# Patient Record
Sex: Female | Born: 1980
Health system: Southern US, Community
[De-identification: ages and names within clinical notes are randomized; demographics above are authoritative.]

## PROBLEM LIST (undated history)

## (undated) DIAGNOSIS — O905 Postpartum thyroiditis: Secondary | ICD-10-CM

## (undated) DIAGNOSIS — A609 Anogenital herpesviral infection, unspecified: Secondary | ICD-10-CM

## (undated) DIAGNOSIS — R569 Unspecified convulsions: Secondary | ICD-10-CM

## (undated) DIAGNOSIS — D1800 Hemangioma unspecified site: Secondary | ICD-10-CM

## (undated) HISTORY — DX: Postpartum thyroiditis: O90.5

## (undated) HISTORY — DX: Unspecified convulsions: R56.9

## (undated) HISTORY — DX: Hemangioma unspecified site: D18.00

## (undated) HISTORY — PX: KNEE DISLOCATION SURGERY: SHX689

## (undated) HISTORY — PX: ADENOIDECTOMY: SUR15

---

## 2010-12-05 HISTORY — PX: TONSILLECTOMY: SUR1361

## 2011-11-02 LAB — OB RESULTS CONSOLE HEPATITIS B SURFACE ANTIGEN: Hepatitis B Surface Ag: NEGATIVE

## 2011-11-02 LAB — OB RESULTS CONSOLE ABO/RH: RH Type: POSITIVE

## 2011-11-07 LAB — OB RESULTS CONSOLE GC/CHLAMYDIA
Chlamydia: NEGATIVE
Gonorrhea: NEGATIVE

## 2011-12-06 ENCOUNTER — Inpatient Hospital Stay (HOSPITAL_COMMUNITY): Admission: AD | Admit: 2011-12-06 | Payer: Self-pay | Source: Ambulatory Visit | Admitting: Obstetrics & Gynecology

## 2011-12-06 NOTE — L&D Delivery Note (Signed)
Delivery Note At 5:33 AM a viable and healthy female was delivered via Vaginal, Spontaneous Delivery (Presentation: ; Occiput Anterior).right hand compound  APGAR: 7, 9; weight 6 lb 15 oz (3147 g).   Placenta status: Intact, Spontaneous, marginal cord Pathology.  Cord: 3 vessels with the following complications: None.  Cord pH: none  Anesthesia: Epidural  Episiotomy: None Lacerations: Vaginal;Sulcus Suture Repair: 3.0 chromic Est. Blood Loss (mL): 300  Mom to postpartum.  Baby to nursery-stable.  Hoover Grewe A 05/30/2012, 6:10 AM

## 2012-03-09 LAB — OB RESULTS CONSOLE RPR
RPR: NONREACTIVE
RPR: NONREACTIVE

## 2012-05-27 ENCOUNTER — Encounter (HOSPITAL_COMMUNITY): Payer: Self-pay | Admitting: *Deleted

## 2012-05-27 ENCOUNTER — Inpatient Hospital Stay (HOSPITAL_COMMUNITY)
Admission: AD | Admit: 2012-05-27 | Discharge: 2012-05-27 | Disposition: A | Discharge status: Home / Self Care | Payer: BC Managed Care – PPO | Source: Ambulatory Visit | Attending: Obstetrics & Gynecology | Admitting: Obstetrics & Gynecology

## 2012-05-27 DIAGNOSIS — O99891 Other specified diseases and conditions complicating pregnancy: Secondary | ICD-10-CM | POA: Insufficient documentation

## 2012-05-27 HISTORY — DX: Anogenital herpesviral infection, unspecified: A60.9

## 2012-05-27 NOTE — ED Provider Notes (Signed)
Carmen Perez is a 31 y.o. female G1P0 [redacted]w[redacted]d presenting for possibility of rupture of membranes.  HPI:  Felt fluid leak x 1 around lunch time today.  No further leakage.  Mild irregular UCs.  Good FMs.  OB History    Grav Para Term Preterm Abortions TAB SAB Ect Mult Living   1              Past Medical History  Diagnosis Date  . HSV (herpes simplex virus) anogenital infection    Past Surgical History  Procedure Date  . Tonsillectomy   . Knee dislocation surgery    Family History: family history is negative for Other. Social History:  reports that she has never smoked. She does not have any smokeless tobacco history on file. She reports that she does not drink alcohol or use illicit drugs. No current facility-administered medications for this encounter. No Known Allergies  Dilation: 3 Effacement (%): 90 Exam by:: Dr Fuller Plan Blood pressure 120/74, pulse 87, temperature 98.4 F (36.9 C), temperature source Oral, resp. rate 16, height 5\' 7"  (1.702 m), weight 77.928 kg (171 lb 12.8 oz).  No evidence of AF.  Fern neg. FHR 140's accelerations present.  Mild non repetitive variable decelerations x 2. Irregular mild UCs.  HPP: There is no problem list on file for this patient.   Prenatal labs: ABO, Rh:  pos Antibody:  neg Rubella:  Imm RPR:  NR HBsAg:  NR HIV:    Genetic testing:Quad neg [place] anato: wnl 1 hr GTT: 99 wnl GBS:  neg  Assessment/Plan:  39+ wks  No evidence of SROM, not in labor.  Will complete NST.  If reactive, d/c home.  Labor precautions discussed.  Jaidyn Kuhl,MARIE-LYNE 05/27/2012, 3:25 PM

## 2012-05-27 NOTE — MAU Note (Signed)
?  SROM @ 1215 today;

## 2012-05-27 NOTE — MAU Note (Signed)
Pt reports having a little bit fluid trickle out. No c/o contractions or bleeding. Good fetal movement

## 2012-05-27 NOTE — MAU Note (Signed)
Delivered vaginally on 02-24-2011; at her 6 week postpartum check-up -she was started on Depo; missed October Depo injection; has not had a period; pt has not had a regular period since her baby was born; 

## 2012-05-27 NOTE — MAU Note (Signed)
Delivered vaginally on 02-24-2011; at her 6 week postpartum check-up -she was started on Depo; missed October Depo injection; has not had a period; pt has not had a regular period since her baby was born;

## 2012-05-27 NOTE — Discharge Instructions (Signed)
Normal Labor and Delivery Your caregiver must first be sure you are in labor. Signs of labor include:  You may pass what is called "the mucus plug" before labor begins. This is a small amount of blood stained mucus.   Regular uterine contractions.   The time between contractions get closer together.   The discomfort and pain gradually gets more intense.   Pains are mostly located in the back.   Pains get worse when walking.   The cervix (the opening of the uterus becomes thinner (begins to efface) and opens up (dilates).  Once you are in labor and admitted into the hospital or care center, your caregiver will do the following:  A complete physical examination.   Check your vital signs (blood pressure, pulse, temperature and the fetal heart rate).   Do a vaginal examination (using a sterile glove and lubricant) to determine:   The position (presentation) of the baby (head [vertex] or buttock first).   The level (station) of the baby's head in the birth canal.   The effacement and dilatation of the cervix.   You may have your pubic hair shaved and be given an enema depending on your caregiver and the circumstance.   An electronic monitor is usually placed on your abdomen. The monitor follows the length and intensity of the contractions, as well as the baby's heart rate.   Usually, your caregiver will insert an IV in your arm with a bottle of sugar water. This is done as a precaution so that medications can be given to you quickly during labor or delivery.  NORMAL LABOR AND DELIVERY IS DIVIDED UP INTO 3 STAGES: First Stage This is when regular contractions begin and the cervix begins to efface and dilate. This stage can last from 3 to 15 hours. The end of the first stage is when the cervix is 100% effaced and 10 centimeters dilated. Pain medications may be given by   Injection (morphine, demerol, etc.)   Regional anesthesia (spinal, caudal or epidural, anesthetics given in  different locations of the spine). Paracervical pain medication may be given, which is an injection of and anesthetic on each side of the cervix.  A pregnant woman may request to have "Natural Childbirth" which is not to have any medications or anesthesia during her labor and delivery. Second Stage This is when the baby comes down through the birth canal (vagina) and is born. This can take 1 to 4 hours. As the baby's head comes down through the birth canal, you may feel like you are going to have a bowel movement. You will get the urge to bear down and push until the baby is delivered. As the baby's head is being delivered, the caregiver will decide if an episiotomy (a cut in the perineum and vagina area) is needed to prevent tearing of the tissue in this area. The episiotomy is sewn up after the delivery of the baby and placenta. Sometimes a mask with nitrous oxide is given for the mother to breath during the delivery of the baby to help if there is too much pain. The end of Stage 2 is when the baby is fully delivered. Then when the umbilical cord stops pulsating it is clamped and cut. Third Stage The third stage begins after the baby is completely delivered and ends after the placenta (afterbirth) is delivered. This usually takes 5 to 30 minutes. After the placenta is delivered, a medication is given either by intravenous or injection to help contract   the uterus and prevent bleeding. The third stage is not painful and pain medication is usually not necessary. If an episiotomy was done, it is repaired at this time. After the delivery, the mother is watched and monitored closely for 1 to 2 hours to make sure there is no postpartum bleeding (hemorrhage). If there is a lot of bleeding, medication is given to contract the uterus and stop the bleeding. Document Released: 08/30/2008 Document Revised: 11/10/2011 Document Reviewed: 08/30/2008 [employer] Patient Information 2012 [employer]. 

## 2012-05-29 ENCOUNTER — Inpatient Hospital Stay (HOSPITAL_COMMUNITY)
Admission: AD | Admit: 2012-05-29 | Discharge: 2012-06-01 | DRG: 373 | Disposition: A | Discharge status: Home / Self Care | Payer: BC Managed Care – PPO | Source: Ambulatory Visit | Attending: Obstetrics and Gynecology | Admitting: Obstetrics and Gynecology

## 2012-05-29 ENCOUNTER — Encounter (HOSPITAL_COMMUNITY): Payer: Self-pay | Admitting: *Deleted

## 2012-05-29 ENCOUNTER — Inpatient Hospital Stay (HOSPITAL_COMMUNITY): Payer: BC Managed Care – PPO | Admitting: Anesthesiology

## 2012-05-29 ENCOUNTER — Encounter (HOSPITAL_COMMUNITY): Payer: Self-pay | Admitting: Anesthesiology

## 2012-05-29 DIAGNOSIS — O328XX0 Maternal care for other malpresentation of fetus, not applicable or unspecified: Secondary | ICD-10-CM | POA: Diagnosis present

## 2012-05-29 LAB — CBC
HCT: 38.2 % (ref 36.0–46.0)
Hemoglobin: 13.2 g/dL (ref 12.0–15.0)
MCV: 88.6 fL (ref 78.0–100.0)
RBC: 4.31 MIL/uL (ref 3.87–5.11)
WBC: 13.1 10*3/uL — ABNORMAL HIGH (ref 4.0–10.5)

## 2012-05-29 MED ORDER — LACTATED RINGERS IV SOLN
INTRAVENOUS | Status: DC
  Administered 2012-05-29: 23:00:00 via INTRAVENOUS
  Administered 2012-05-29: 1000 mL via INTRAVENOUS
  Administered 2012-05-30: 02:00:00 via INTRAVENOUS

## 2012-05-29 MED ORDER — ONDANSETRON HCL 4 MG/2ML IJ SOLN: 4.0000 mg | Freq: Four times a day (QID) | INTRAMUSCULAR | Status: DC | PRN

## 2012-05-29 MED ORDER — OXYTOCIN 40 UNITS IN LACTATED RINGERS INFUSION - SIMPLE MED
INTRAVENOUS | Status: AC
  Filled 2012-05-29: qty 1000

## 2012-05-29 MED ORDER — DIPHENHYDRAMINE HCL 50 MG/ML IJ SOLN: 12.5000 mg | INTRAMUSCULAR | Status: DC | PRN

## 2012-05-29 MED ORDER — LACTATED RINGERS IV SOLN
500.0000 mL | Freq: Once | INTRAVENOUS | Status: AC
  Administered 2012-05-29: 500 mL via INTRAVENOUS

## 2012-05-29 MED ORDER — SODIUM BICARBONATE 8.4 % IV SOLN
INTRAVENOUS | Status: DC | PRN
  Administered 2012-05-29: 4 mL via EPIDURAL

## 2012-05-29 MED ORDER — CITRIC ACID-SODIUM CITRATE 334-500 MG/5ML PO SOLN: 30.0000 mL | ORAL | Status: DC | PRN

## 2012-05-29 MED ORDER — LIDOCAINE HCL (PF) 1 % IJ SOLN
INTRAMUSCULAR | Status: AC
  Filled 2012-05-29: qty 30

## 2012-05-29 MED ORDER — FLEET ENEMA 7-19 GM/118ML RE ENEM: 1.0000 | ENEMA | RECTAL | Status: DC | PRN

## 2012-05-29 MED ORDER — PHENYLEPHRINE 40 MCG/ML (10ML) SYRINGE FOR IV PUSH (FOR BLOOD PRESSURE SUPPORT): 80.0000 ug | PREFILLED_SYRINGE | INTRAVENOUS | Status: DC | PRN

## 2012-05-29 MED ORDER — NALBUPHINE HCL 10 MG/ML IJ SOLN: 10.0000 mg | INTRAMUSCULAR | Status: DC | PRN

## 2012-05-29 MED ORDER — EPHEDRINE 5 MG/ML INJ
10.0000 mg | INTRAVENOUS | Status: DC | PRN
  Administered 2012-05-29: 10 mg via INTRAVENOUS
  Filled 2012-05-29: qty 4

## 2012-05-29 MED ORDER — OXYTOCIN BOLUS FROM INFUSION
250.0000 mL | Freq: Once | INTRAVENOUS | Status: DC
  Filled 2012-05-29: qty 500

## 2012-05-29 MED ORDER — OXYTOCIN 40 UNITS IN LACTATED RINGERS INFUSION - SIMPLE MED
62.5000 mL/h | Freq: Once | INTRAVENOUS | Status: AC
  Administered 2012-05-30: 125 mL/h via INTRAVENOUS

## 2012-05-29 MED ORDER — FENTANYL 2.5 MCG/ML BUPIVACAINE 1/10 % EPIDURAL INFUSION (WH - ANES)
14.0000 mL/h | INTRAMUSCULAR | Status: DC
  Administered 2012-05-30 (×2): 14 mL/h via EPIDURAL
  Filled 2012-05-29 (×4): qty 60

## 2012-05-29 MED ORDER — LIDOCAINE HCL (PF) 1 % IJ SOLN: 30.0000 mL | INTRAMUSCULAR | Status: DC | PRN

## 2012-05-29 MED ORDER — LACTATED RINGERS IV SOLN: 500.0000 mL | INTRAVENOUS | Status: DC | PRN

## 2012-05-29 MED ORDER — IBUPROFEN 600 MG PO TABS: 600.0000 mg | ORAL_TABLET | Freq: Four times a day (QID) | ORAL | Status: DC | PRN

## 2012-05-29 MED ORDER — ACETAMINOPHEN 325 MG PO TABS: 650.0000 mg | ORAL_TABLET | ORAL | Status: DC | PRN

## 2012-05-29 MED ORDER — EPHEDRINE 5 MG/ML INJ
10.0000 mg | INTRAVENOUS | Status: AC | PRN
  Administered 2012-05-29 (×2): 10 mg via INTRAVENOUS
  Filled 2012-05-29 (×2): qty 4

## 2012-05-29 MED ORDER — FENTANYL 2.5 MCG/ML BUPIVACAINE 1/10 % EPIDURAL INFUSION (WH - ANES)
INTRAMUSCULAR | Status: DC | PRN
  Administered 2012-05-29: 14 mL/h via EPIDURAL

## 2012-05-29 MED ORDER — PHENYLEPHRINE 40 MCG/ML (10ML) SYRINGE FOR IV PUSH (FOR BLOOD PRESSURE SUPPORT)
80.0000 ug | PREFILLED_SYRINGE | INTRAVENOUS | Status: DC | PRN
  Administered 2012-05-29 (×2): 80 ug via INTRAVENOUS
  Filled 2012-05-29 (×2): qty 5

## 2012-05-29 MED ORDER — OXYCODONE-ACETAMINOPHEN 5-325 MG PO TABS: 1.0000 | ORAL_TABLET | ORAL | Status: DC | PRN

## 2012-05-29 NOTE — H&P (Signed)
DAMA Perez is Perez 31 y.o. female presenting for admission for active labor. Intact membrane. GBS cx neg. Hx HSV on Valtrex. Denies recent outbreak or prodromal sx  Maternal Medical History:  Reason for admission: Reason for admission: contractions.  Contractions: Frequency: regular.    Fetal activity: Perceived fetal activity is normal.    Prenatal complications: no prenatal complications   OB History    Grav Para Term Preterm Abortions TAB SAB Ect Mult Living   1              Past Medical History  Diagnosis Date  . HSV (herpes simplex virus) anogenital infection    Past Surgical History  Procedure Date  . Tonsillectomy   . Knee dislocation surgery    Family History: family history is negative for Other. Social History:  reports that she has never smoked. She does not have any smokeless tobacco history on file. She reports that she does not drink alcohol or use illicit drugs.   Prenatal Transfer Tool  Maternal Diabetes: No Genetic Screening: Normal Maternal Ultrasounds/Referrals: Normal Fetal Ultrasounds or other Referrals:  None Maternal Substance Abuse:  No Significant Maternal Medications:  None Significant Maternal Lab Results:  Lab values include: Group B Strep negative Other Comments:  None  ROSneg  Dilation: 8.5 Effacement (%): 100 Station: 0;-1 Exam by:: Diamond Nickel, RN Blood pressure 115/69, pulse 65, temperature 97.4 F (36.3 C), temperature source Oral, resp. rate 22, height 5\' 7"  (1.702 m), weight 77.565 kg (171 lb). Maternal Exam:  Uterine Assessment: Contraction strength is moderate.  Abdomen: Patient reports no abdominal tenderness. Introitus: Normal vulva. Normal vagina.  Cervix: Cervix evaluated by digital exam.     Physical Exam  Constitutional: She is oriented to person, place, and time. She appears well-developed and well-nourished.  HENT:  Head: Atraumatic.  Eyes: EOM are normal.  Neck: Neck supple.  Cardiovascular: Normal rate and  regular rhythm.   Respiratory: Breath sounds normal.  GI: Soft.  Musculoskeletal: Normal range of motion. She exhibits no edema.  Neurological: She is alert and oriented to person, place, and time.  Skin: Skin is warm and dry.  Psychiatric: She has Perez normal mood and affect.   VE: 6/90/0 station (+) bloody show  Prenatal labs: ABO, Rh:  O positive Antibody:  neg Rubella:  immune RPR:   nr HBsAg:   neg HIV:   neg GBS:   neg  Assessment/Plan: Active labor  Term gestation P) admit. Routine labs. Analgesic. Pt declines amniotomy    Carmen Perez 05/29/2012, 9:21 PM

## 2012-05-29 NOTE — Progress Notes (Signed)
S: notes intermittent rectal pressure  O: epidural  VE fully /0 station bulging membrane. AROM : mod meconium. ISE placed LOA  Tracing: baseline 140's minimum variability ? Ctx frequency  IMP: Complete P) labor vtx down. Cont mat O2. Pitocin prn

## 2012-05-29 NOTE — Anesthesia Procedure Notes (Signed)

## 2012-05-29 NOTE — MAU Note (Signed)
Pt reports ctx started a 7 pm very strong. Pt reports some leaking of pinkish fluid. Reports good fetal movment

## 2012-05-29 NOTE — MAU Note (Signed)
Dr Cousins at bedside discussing plan of care. 

## 2012-05-29 NOTE — Anesthesia Preprocedure Evaluation (Signed)

## 2012-05-30 ENCOUNTER — Encounter (HOSPITAL_COMMUNITY): Payer: Self-pay | Admitting: *Deleted

## 2012-05-30 MED ORDER — OXYTOCIN 40 UNITS IN LACTATED RINGERS INFUSION - SIMPLE MED
1.0000 m[IU]/min | INTRAVENOUS | Status: DC
  Administered 2012-05-30: 2 m[IU]/min via INTRAVENOUS

## 2012-05-30 MED ORDER — LACTATED RINGERS IV SOLN
INTRAVENOUS | Status: DC
  Administered 2012-05-30: 1000 mL via INTRAUTERINE

## 2012-05-30 MED ORDER — TERBUTALINE SULFATE 1 MG/ML IJ SOLN: 0.2500 mg | Freq: Once | INTRAMUSCULAR | Status: DC | PRN

## 2012-05-30 MED ORDER — SENNOSIDES-DOCUSATE SODIUM 8.6-50 MG PO TABS
2.0000 | ORAL_TABLET | Freq: Every day | ORAL | Status: DC
  Administered 2012-05-30 – 2012-05-31 (×2): 2 via ORAL

## 2012-05-30 MED ORDER — SIMETHICONE 80 MG PO CHEW: 80.0000 mg | CHEWABLE_TABLET | ORAL | Status: DC | PRN

## 2012-05-30 MED ORDER — BENZOCAINE-MENTHOL 20-0.5 % EX AERO
1.0000 "application " | INHALATION_SPRAY | CUTANEOUS | Status: DC | PRN
  Filled 2012-05-30: qty 56

## 2012-05-30 MED ORDER — ONDANSETRON HCL 4 MG/2ML IJ SOLN: 4.0000 mg | INTRAMUSCULAR | Status: DC | PRN

## 2012-05-30 MED ORDER — LANOLIN HYDROUS EX OINT: TOPICAL_OINTMENT | CUTANEOUS | Status: DC | PRN

## 2012-05-30 MED ORDER — FERROUS SULFATE 325 (65 FE) MG PO TABS
325.0000 mg | ORAL_TABLET | Freq: Two times a day (BID) | ORAL | Status: DC
  Administered 2012-05-30 – 2012-05-31 (×2): 325 mg via ORAL
  Filled 2012-05-30 (×2): qty 1

## 2012-05-30 MED ORDER — TETANUS-DIPHTH-ACELL PERTUSSIS 5-2.5-18.5 LF-MCG/0.5 IM SUSP
0.5000 mL | Freq: Once | INTRAMUSCULAR | Status: AC
  Administered 2012-05-31: 0.5 mL via INTRAMUSCULAR
  Filled 2012-05-30: qty 0.5

## 2012-05-30 MED ORDER — ONDANSETRON HCL 4 MG PO TABS: 4.0000 mg | ORAL_TABLET | ORAL | Status: DC | PRN

## 2012-05-30 MED ORDER — WITCH HAZEL-GLYCERIN EX PADS: 1.0000 "application " | MEDICATED_PAD | CUTANEOUS | Status: DC | PRN

## 2012-05-30 MED ORDER — DIBUCAINE 1 % RE OINT
1.0000 "application " | TOPICAL_OINTMENT | RECTAL | Status: DC | PRN
  Administered 2012-06-01: 1 via RECTAL
  Filled 2012-05-30: qty 28

## 2012-05-30 MED ORDER — DIPHENHYDRAMINE HCL 25 MG PO CAPS: 25.0000 mg | ORAL_CAPSULE | Freq: Four times a day (QID) | ORAL | Status: DC | PRN

## 2012-05-30 MED ORDER — ZOLPIDEM TARTRATE 5 MG PO TABS: 5.0000 mg | ORAL_TABLET | Freq: Every evening | ORAL | Status: DC | PRN

## 2012-05-30 MED ORDER — OXYCODONE-ACETAMINOPHEN 5-325 MG PO TABS: 1.0000 | ORAL_TABLET | ORAL | Status: DC | PRN

## 2012-05-30 MED ORDER — PRENATAL MULTIVITAMIN CH
1.0000 | ORAL_TABLET | Freq: Every day | ORAL | Status: DC
  Administered 2012-05-31 – 2012-06-01 (×2): 1 via ORAL
  Filled 2012-05-30 (×2): qty 1

## 2012-05-30 MED ORDER — IBUPROFEN 600 MG PO TABS
600.0000 mg | ORAL_TABLET | Freq: Four times a day (QID) | ORAL | Status: DC
  Administered 2012-05-30 – 2012-06-01 (×7): 600 mg via ORAL
  Filled 2012-05-30 (×8): qty 1

## 2012-05-30 NOTE — Progress Notes (Signed)
S:  Called by RN regarding pt pushing for 1 1/2 hours  O: Pitocin 4 MIU  Tracing: reassuring  ctx q 2 mins  VE: fully +1/+2 station OP  IMP: Complete w/ OP presentation  P) cont pushing. Unable to increase pitocin. Not pushed long enough to call failed descent

## 2012-05-30 NOTE — Progress Notes (Signed)
Pushing

## 2012-05-30 NOTE — Progress Notes (Signed)
Dr. Cherly Hensen at bedside from 0000 to 0036 to assess patient trial pushing and effectiveness of contractions. Orders for amnioinfusion and pitocin received and implemented.

## 2012-05-30 NOTE — Progress Notes (Signed)
S: notes some rectal pressure  O: Epidural  VE: fully +1 station  Pushed with pt. Mild ctx. Ctx q 3-4 mins   tracing; some early decels baseline130's -140 (+) scalp stimulation  IUPC placed. Amnioinfusion ordered  IMP: Suboptimal ctx w/ poss cord compression  P) stop pushing. Amnioinfusion. Pitocin augmentation. Plan explained to pt and husband

## 2012-05-30 NOTE — Progress Notes (Signed)
0236 Pushing, spoke with Dr. Cherly Hensen, cannot increase pitocin due to contraction frequency, however contractions are not of good quality.

## 2012-05-31 LAB — CBC
MCH: 30.2 pg (ref 26.0–34.0)
MCHC: 33.2 g/dL (ref 30.0–36.0)
MCV: 90.9 fL (ref 78.0–100.0)
Platelets: 281 10*3/uL (ref 150–400)
RBC: 3.74 MIL/uL — ABNORMAL LOW (ref 3.87–5.11)
RDW: 14.3 % (ref 11.5–15.5)

## 2012-05-31 NOTE — Anesthesia Postprocedure Evaluation (Signed)
  Anesthesia Post-op Note  Patient: Carmen Perez  Procedure(s) Performed: * No procedures listed *  Patient Location: Mother/Baby  Anesthesia Type: Epidural  Level of Consciousness: awake, alert  and oriented  Airway and Oxygen Therapy: Patient Spontanous Breathing  Post-op Pain: none  Post-op Assessment: Post-op Vital signs reviewed and Patient's Cardiovascular Status Stable  Post-op Vital Signs: Reviewed and stable  Complications: No apparent anesthesia complications

## 2012-05-31 NOTE — Progress Notes (Signed)
PPD 1 SVD  S:  Reports feeling well             Tolerating po/ No nausea or vomiting             Bleeding is light             Pain controlled with motrin and percocet occasionally             Up ad lib / ambulatory  Newborn breast feeding - having some difficulty with latch    O:  A & O x 3              VS: Blood pressure 116/80, pulse 89, temperature 97.7 F (36.5 C), temperature source Oral, resp. rate 18, height 5\' 7"  (1.702 m), weight 77.565 kg (171 lb), SpO2 100.00%, unknown if currently breastfeeding.  LABS: WBC/Hgb/Hct/Plts:  13.4/11.3/34.0/281 (06/27 0545)   Lungs: Clear and unlabored  Heart: regular rate and rhythm / no mumurs  Abdomen: soft, non-tender, non-distended              Fundus: firm, non-tender, U-1  Perineum: no edema / ice pack in place  Lochia: light   Extremities: no edema, no calf pain or tenderness    A: PPD # 1   Doing well - stable status  P:  Routine post partum orders  Anticipate discharge tomorrow             Lactation guidance today  Marlinda Mike SNM 05/31/2012, 10:14 AM

## 2012-06-01 MED ORDER — IBUPROFEN 600 MG PO TABS: 600.0000 mg | ORAL_TABLET | Freq: Four times a day (QID) | ORAL | Status: AC

## 2012-06-01 MED ORDER — OXYCODONE-ACETAMINOPHEN 5-325 MG PO TABS: 1.0000 | ORAL_TABLET | Freq: Four times a day (QID) | ORAL | Status: AC | PRN

## 2012-06-01 NOTE — Discharge Summary (Signed)
Obstetric Discharge Summary Reason for Admission: onset of labor and [redacted]wk gestation Prenatal Procedures: ultrasound Intrapartum Procedures: spontaneous vaginal delivery Postpartum Procedures: none Complications-Operative and Postpartum: none Hemoglobin  Date Value Range Status  05/31/2012 11.3* 12.0 - 15.0 g/dL Final     HCT  Date Value Range Status  05/31/2012 34.0* 36.0 - 46.0 % Final    Physical Exam:  General: alert, cooperative and no distress Lochia: appropriate Uterine Fundus: firm Incision: n/a DVT Evaluation: No evidence of DVT seen on physical exam.  Discharge Diagnoses: Term Pregnancy-delivered  Discharge Information: Date: 06/01/2012 Activity: pelvic rest Diet: routine Medications: PNV, Ibuprofen and Percocet Condition: stable Instructions: refer to practice specific booklet Discharge to: home   Newborn Data: Live born female on 05/30/12 Birth Weight: 6 lb 15 oz (3147 g) APGAR: 7, 9  Home with mother.  Brandan Robicheaux K 06/01/2012, 9:05 AM

## 2012-06-01 NOTE — Progress Notes (Signed)
Patient ID: Carmen Perez, female   DOB: [birth year], 31 y.o.   MRN: [id number] PPD # 2  Subjective: Pt reports feeling well and eager for d/c home/ Pain controlled with ibuprofen and rare percocet Tolerating po/ Voiding without problems/ No n/v Bleeding is light/ Newborn info:  Information for the patient's newborn:  Amarissa, Koerner Girl Brylie [[id number]]  female Feeding: breast    Objective:  VS: Blood pressure 101/61, pulse 73, temperature 98.7 F (37.1 C), temperature source Oral, resp. rate 20   Basename 05/31/12 0545 05/29/12 2055  WBC 13.4* 13.1*  HGB 11.3* 13.2  HCT 34.0* 38.2  PLT 281 345    Blood type: --/--/O POS (06/25 2130) Rubella: Immune (11/28 0000)    Physical Exam:  General: A & O x 3  alert, cooperative and no distress CV: Regular rate and rhythm Resp: clear Abdomen: soft, nontender, normal bowel sounds Uterine Fundus: firm, below umbilicus, nontender Perineum: healing well; no edema Lochia: moderate Ext: Homans sign is negative, no sign of DVT and no edema, redness or tenderness in the calves or thighs    A/P: PPD # 2/ G1P1001/ S/P:spontaneous vaginal delivery Doing well and stable for discharge home RX: Ibuprofen 600mg  po Q 6 hrs prn pain #30 Refill x 1 Percocet 5/325 1 to 2 po Q 4 hrs prn pain #15 No refill WOB/GYN booklet given Routine pp visit in 6wks   Demetrius Revel, MSN, [facility] 06/01/2012, 9:01 AM

## 2014-10-06 ENCOUNTER — Encounter (HOSPITAL_COMMUNITY): Payer: Self-pay | Admitting: *Deleted

## 2015-05-28 ENCOUNTER — Other Ambulatory Visit: Payer: Self-pay | Admitting: Obstetrics and Gynecology

## 2015-06-01 LAB — CYTOLOGY - PAP

## 2016-02-12 ENCOUNTER — Emergency Department (HOSPITAL_COMMUNITY): Payer: BLUE CROSS/BLUE SHIELD

## 2016-02-12 ENCOUNTER — Encounter (HOSPITAL_COMMUNITY): Payer: Self-pay | Admitting: Physical Medicine and Rehabilitation

## 2016-02-12 ENCOUNTER — Inpatient Hospital Stay (HOSPITAL_COMMUNITY)
Admission: EM | Admit: 2016-02-12 | Discharge: 2016-02-13 | DRG: 101 | Disposition: A | Discharge status: Home / Self Care | Payer: BLUE CROSS/BLUE SHIELD | Attending: Internal Medicine | Admitting: Internal Medicine

## 2016-02-12 DIAGNOSIS — R531 Weakness: Secondary | ICD-10-CM | POA: Diagnosis present

## 2016-02-12 DIAGNOSIS — R202 Paresthesia of skin: Secondary | ICD-10-CM

## 2016-02-12 DIAGNOSIS — G459 Transient cerebral ischemic attack, unspecified: Secondary | ICD-10-CM | POA: Diagnosis not present

## 2016-02-12 DIAGNOSIS — Q283 Other malformations of cerebral vessels: Secondary | ICD-10-CM | POA: Diagnosis not present

## 2016-02-12 DIAGNOSIS — R200 Anesthesia of skin: Secondary | ICD-10-CM | POA: Diagnosis present

## 2016-02-12 DIAGNOSIS — R569 Unspecified convulsions: Principal | ICD-10-CM

## 2016-02-12 DIAGNOSIS — E860 Dehydration: Secondary | ICD-10-CM | POA: Diagnosis present

## 2016-02-12 DIAGNOSIS — G9389 Other specified disorders of brain: Secondary | ICD-10-CM | POA: Diagnosis present

## 2016-02-12 LAB — I-STAT CHEM 8, ED
BUN: 11 mg/dL (ref 6–20)
CALCIUM ION: 1.11 mmol/L — AB (ref 1.12–1.23)
CREATININE: 0.6 mg/dL (ref 0.44–1.00)
Chloride: 105 mmol/L (ref 101–111)
GLUCOSE: 94 mg/dL (ref 65–99)
HCT: 37 % (ref 36.0–46.0)
HEMOGLOBIN: 12.6 g/dL (ref 12.0–15.0)
Potassium: 3.7 mmol/L (ref 3.5–5.1)
Sodium: 142 mmol/L (ref 135–145)
TCO2: 22 mmol/L (ref 0–100)

## 2016-02-12 MED ORDER — ACETAMINOPHEN 650 MG RE SUPP: 650.0000 mg | RECTAL | Status: DC | PRN

## 2016-02-12 MED ORDER — GADOBENATE DIMEGLUMINE 529 MG/ML IV SOLN
15.0000 mL | Freq: Once | INTRAVENOUS | Status: AC | PRN
  Administered 2016-02-12: 15 mL via INTRAVENOUS

## 2016-02-12 MED ORDER — PROPOFOL 1000 MG/100ML IV EMUL: 5.0000 ug/kg/min | INTRAVENOUS | Status: DC

## 2016-02-12 MED ORDER — STROKE: EARLY STAGES OF RECOVERY BOOK: Freq: Once | Status: DC

## 2016-02-12 MED ORDER — LEVETIRACETAM 500 MG PO TABS
500.0000 mg | ORAL_TABLET | Freq: Two times a day (BID) | ORAL | Status: DC
  Administered 2016-02-12 – 2016-02-13 (×2): 500 mg via ORAL
  Filled 2016-02-12 (×2): qty 1

## 2016-02-12 MED ORDER — ACETAMINOPHEN 325 MG PO TABS: 650.0000 mg | ORAL_TABLET | ORAL | Status: DC | PRN

## 2016-02-12 MED ORDER — ASPIRIN 300 MG RE SUPP: 300.0000 mg | Freq: Every day | RECTAL | Status: DC

## 2016-02-12 MED ORDER — ASPIRIN 325 MG PO TABS: 325.0000 mg | ORAL_TABLET | Freq: Every day | ORAL | Status: DC

## 2016-02-12 NOTE — H&P (Addendum)
[facility] History and Physical  Carmen Perez [id number] DOB: 1981/07/15 DOA: 02/12/2016  Referring physician: EDP PCP: No primary care provider on file.   Chief Complaint: Right sided numbness  HPI: Carmen Perez is a 35 y.o. female with no significant past medical history presented to the ER at with numbness and weakness involving her right arm and minimally her right leg to starting around 11 AM today. she recalls intermittent symptoms of right lower back pain and dizziness off and on for a long time which she attributed to dehydration. She denies any other symptoms at this time her numbness and weakness involving her right arm which have now resolved. She denies any fevers, rash, visual symptoms. She denies any trauma, no history of seizures. In the emergency room underwent an MRI of her brain which was significant for a 1.5 cm lesion in the left cingulate gyrus which Is likely a cavernoma  Review of Systems: positives Constitutional:  No weight loss, night sweats, Fevers, chills, fatigue.  HEENT:  No headaches, Difficulty swallowing,Tooth/dental problems,Sore throat,  No sneezing, itching, ear ache, nasal congestion, post nasal drip,  Cardio-vascular:  No chest pain, Orthopnea, PND, swelling in lower extremities, anasarca, dizziness, palpitations  GI:  No heartburn, indigestion, abdominal pain, nausea, vomiting, diarrhea, change in bowel habits, loss of appetite  Resp:  No shortness of breath with exertion or at rest. No excess mucus, no productive cough, No non-productive cough, No coughing up of blood.No change in color of mucus.No wheezing.No chest wall deformity  Skin:  no rash or lesions.  GU:  no dysuria, change in color of urine, no urgency or frequency. No flank pain.  Musculoskeletal:  No joint pain or swelling. No decreased range of motion. No back pain.  Psych:  No change in mood or affect. No depression or anxiety. No memory loss.   Past Medical  History  Diagnosis Date  . HSV (herpes simplex virus) anogenital infection    Past Surgical History  Procedure Laterality Date  . Tonsillectomy    . Knee dislocation surgery     Social History:  reports that she has never smoked. She does not have any smokeless tobacco history on file. She reports that she does not drink alcohol or use illicit drugs.  No Known Allergies  Family History  Problem Relation Age of Onset  . Other Neg Hx    no h/o CVA  Prior to Admission medications   Medication Sig Start Date End Date Taking? Authorizing Provider  Prenatal Vit-Fe Fumarate-FA (PRENATAL MULTIVITAMIN) TABS Take 1 tablet by mouth daily.    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 No LE edema. Telemetry: SR, no arrhythmias  Respiratory: CTA bilaterally, no w/r/r. Normal respiratory effort. Abdomen: soft, ntnd Skin: no rash or induration seen on limited exam Musculoskeletal: grossly normal tone BUE/BLE Psychiatric: grossly normal mood and affect, speech fluent and appropriate Neurologic:  cranial nerves II-12 grossly  intact, motor 5 x 5 in both upper and lower extremity proximal proximal and distal muscle groups, sensation light touch intact in upper and lower extremities, DTR is 2+, plantars are withdrawal, cerebellar not checked           Labs on Admission:  Basic Metabolic Panel:  Recent Labs Lab 02/12/16 1511  NA 142  K 3.7  CL 105  GLUCOSE  94  BUN 11  CREATININE 0.60   Liver Function Tests: No results for input(s): AST, ALT, ALKPHOS, BILITOT, PROT, ALBUMIN in the last 168 hours. No results for input(s): LIPASE, AMYLASE in the last 168 hours. No results for input(s): AMMONIA in the last 168 hours. CBC:  Recent Labs Lab 02/12/16 1511  HGB 12.6  HCT 37.0   Cardiac Enzymes: No results for input(s): CKTOTAL, CKMB, CKMBINDEX, TROPONINI in the last 168 hours.  BNP (last 3 results) No results for input(s): BNP in the last 8760 hours.  ProBNP (last 3 results) No results for input(s): PROBNP in the last 8760 hours.  CBG: No results for input(s): GLUCAP in the last 168 hours.  Radiological Exams on Admission: Mr Jeri Cos Wo Contrast  02/12/2016  CLINICAL DATA:  Right arm numbness, dizziness, and lightheaded. Symptoms began this morning. Some headache. EXAM: MRI HEAD WITHOUT AND WITH CONTRAST TECHNIQUE: Multiplanar, multiecho pulse sequences of the brain and surrounding structures were obtained without and with intravenous contrast. CONTRAST:  15 mL MultiHance COMPARISON:  None. FINDINGS: There is an approximately 1.5 x 1.0 cm focus of marked T1 and T2 hypointensity and marked susceptibility artifact artifact in the left cingulate gyrus consistent with chronic blood products. There is a small amount of T2 hyperintensity centrally within the lesion. There is no surrounding edema or mass effect. No abnormal enhancement is identified. The brain is otherwise normal in appearance. There is no evidence of acute infarct, acute intracranial hemorrhage, midline shift, or extra-axial fluid collection. Ventricles and sulci are normal. Orbits are unremarkable. Paranasal sinuses and mastoid air cells are clear. Major intracranial vascular flow voids are preserved. IMPRESSION: 1. 1.5 cm lesion in the left cingulate gyrus favored to represent a cavernoma. Other potential considerations such as sequelae of prior trauma or remote hemorrhagic ischemia  are considered less likely. No evidence of recent hemorrhage. 2. Otherwise unremarkable appearance of the brain. The study was discussed by telephone with Dr. Leonard Schwartz on 02/12/2016 at 4:35 pm. Electronically Signed   By: Logan Bores M.D.   On: 02/12/2016 16:51    EKG: Independently reviewed. Pending  Assessment/Plan   Right upper extremity weakness/numbness, with an MRI notable for likely Cerebral cavernoma involving the left cingulate gyrus  .-Discussed with neurology Dr. Nicole Kindred who will consult  - Will check MRA of the brain  -Recommended routine TIA workup , aspirin , lipid panel, HbA1c  -2-D echocardiogram and carotid duplex  - will likely need referral to a tertiary center hospital for further monitoring and management of this cavernoma left  Hypocalcemia -Ical minimally low, will repeat in am  Code Status: Full Code DVT Prophylaxis: SCDs Family Communication: dad at bedside Disposition Plan: home tomorrow  Time spent: 24min  Carmen Perez [facility] Pager [phone]

## 2016-02-12 NOTE — ED Provider Notes (Signed)
CSN: [id number]     Arrival date & time 02/12/16  1214 History   First MD Initiated Contact with Patient 02/12/16 1217     Chief Complaint  Patient presents with  . Spasms     HPI   35 year old female with no known medical history presents for sudden onset right upper extremity numbness, weakness, word finding difficulties, ataxic gait. The symptoms started approximately 11 AM. Prior to this she noted burning like pain in her right lower back and dizziness.   Since then she has had periodic right upper extremity numbness and shaking. This is also spread to her right lower extremity as well. She reports dizziness associated.   She has noted difficulty focusing her gaze on objects during this time period and feels that when she tries to focus on an object she has double vision. She is never had this before.  She called EMS at approximately 11:30 PM. While en route by EMS she noted total body spasm-like tightening it lasted for several seconds then released.  Of note she reports an ongoing episodic history of right lower back burning pain with dizziness over the last several years. This typically resolved after drinking water.  She otherwise denies recent illnesses, fevers, chest pain, shortness of breath,  nausea, vomiting diarrhea, headache, dysuria, vaginal irritation  Past Medical History  Diagnosis Date  . HSV (herpes simplex virus) anogenital infection    Past Surgical History  Procedure Laterality Date  . Tonsillectomy    . Knee dislocation surgery     Family History  Problem Relation Age of Onset  . Other Neg Hx    Social History  Substance Use Topics  . Smoking status: Never Smoker   . Smokeless tobacco: None  . Alcohol Use: No   OB History    Gravida Para Term Preterm AB TAB SAB Ectopic Multiple Living   1 1 1       1      Works as a Occupational hygienist professor   Review of Systems  Constitutional: Negative.   HENT: Negative.   Eyes: Negative.   Respiratory:  Negative.   Cardiovascular: Negative.   Genitourinary: Negative.   Musculoskeletal: Negative.   Neurological: Negative.   Hematological: Negative.     Allergies  Review of patient's allergies indicates no known allergies.  Home Medications   Prior to Admission medications   Medication Sig Start Date End Date Taking? Authorizing Provider  Prenatal Vit-Fe Fumarate-FA (PRENATAL MULTIVITAMIN) TABS Take 1 tablet by mouth daily.    Historical Provider, MD   BP 125/76 mmHg  Pulse 85  Temp(Src) 97.9 F (36.6 C) (Oral)  Resp 17  SpO2 100% Physical Exam  Constitutional: She is oriented to person, place, and time. She appears well-developed and well-nourished.  HENT:  Head: Normocephalic.  Eyes: EOM are normal. Pupils are equal, round, and reactive to light.  Neck: Normal range of motion.  Cardiovascular: Normal rate and regular rhythm.   Pulmonary/Chest: Effort normal and breath sounds normal. No respiratory distress.  Abdominal: Soft. Bowel sounds are normal. She exhibits no distension. There is no tenderness.  Musculoskeletal: Normal range of motion.  Neurological: She is alert and oriented to person, place, and time. No cranial nerve deficit.  Skin: Skin is warm and dry.   Cranial Nerves II - XII - II - Visual field intact III, IV, VI - Extraocular movements intact, no nystagmus V - Facial sensation intact bilaterally. VII - Facial movement intact bilaterally. VIII - Hearing & vestibular  intact bilaterally. X - Palate elevates symmetrically, no dysarthria. XI - Chin turning & shoulder shrug intact bilaterally. XII - Tongue protrusion intact.  Motor Strength - The patient's strength was 5/5 in all extremities and pronator drift was absent. Bulk was normal   Motor Tone - Muscle tone was assessed at the neck and appendages and was normal.  Cerebellar: No ataxia on finger-nose-finger bilaterally  Reflexes: Down going toes on babinski  Coordination - The patient had normal  movements in the hands with no ataxia or dysmetria.   Gait and Station - normal gait, heel toe gait, able to stand on one feet on bilateral feet, able to walk on her toes without difficulty. Negative Romberg  Intermittently reported acute onset of dizziness with progression to right arm and right leg fasciculations. These lasted for approximately 10-15 seconds then resolved spontaneously.  This occurred twice during this exam.   She was distractible during the last episode and her trembling stopped   ED Course  Procedures (including critical care time) Labs Review Labs Reviewed  I-STAT CHEM 8, ED - Abnormal; Notable for the following:    Calcium, Ion 1.11 (*)    All other components within normal limits    Imaging Review Mr Jeri Cos Wo Contrast  02/12/2016  CLINICAL DATA:  Right arm numbness, dizziness, and lightheaded. Symptoms began this morning. Some headache. EXAM: MRI HEAD WITHOUT AND WITH CONTRAST TECHNIQUE: Multiplanar, multiecho pulse sequences of the brain and surrounding structures were obtained without and with intravenous contrast. CONTRAST:  15 mL MultiHance COMPARISON:  None. FINDINGS: There is an approximately 1.5 x 1.0 cm focus of marked T1 and T2 hypointensity and marked susceptibility artifact artifact in the left cingulate gyrus consistent with chronic blood products. There is a small amount of T2 hyperintensity centrally within the lesion. There is no surrounding edema or mass effect. No abnormal enhancement is identified. The brain is otherwise normal in appearance. There is no evidence of acute infarct, acute intracranial hemorrhage, midline shift, or extra-axial fluid collection. Ventricles and sulci are normal. Orbits are unremarkable. Paranasal sinuses and mastoid air cells are clear. Major intracranial vascular flow voids are preserved. IMPRESSION: 1. 1.5 cm lesion in the left cingulate gyrus favored to represent a cavernoma. Other potential considerations such as  sequelae of prior trauma or remote hemorrhagic ischemia are considered less likely. No evidence of recent hemorrhage. 2. Otherwise unremarkable appearance of the brain. The study was discussed by telephone with Dr. Leonard Schwartz on 02/12/2016 at 4:35 pm. Electronically Signed   By: Logan Bores M.D.   On: 02/12/2016 16:51   I have personally reviewed and evaluated these images and lab results as part of my medical decision-making.   EKG Interpretation None      MDM   Final diagnoses:  Numbness and tingling of right arm    35 y/o F with new onset right sided numbness, weakness. Differential includes intracranial process like MS, stroke, intracranial mass or lesion. Will obtain MRI to rule these out. Electrolyte abnormalities could potentially cause spasm and weakness but typically not focally. istat chem8 showed mild hypocalcemia, 1.11, else normal electrolytes.  She has no chest pain , shortness of breath and no cardiac history with no family history of stroke or early heart attack making a cardio genic cause of her symptoms unlikely.  // MRI :1.5 cm lesion in the left cingulate gyrus favored to represent a cavernoma Neurology was consulted and recommended admission for TIA work up.  Will admit, [facility] accepting    Jermany Sundell A. Lincoln Brigham MD, [facility] Resident PGY-2 Pager [phone]     Veatrice Bourbon, MD 02/12/16 1718  Leonard Schwartz, MD 02/21/16 1754

## 2016-02-12 NOTE — ED Notes (Signed)
Attempted report 

## 2016-02-12 NOTE — ED Notes (Signed)
Pt to department via [facility] for evaluation of muscle spasms all over body. Also reports R arm numbness today. No neurological deficits noted upon arrival to ED. Pt is alert and oriented x4

## 2016-02-12 NOTE — Consult Note (Signed)
Neurology Consultation Reason for Consult: Transient episodes of right-sided weakness Referring Physician: Fanny Perez  CC: Right-sided weakness  History is obtained from: Patient  HPI: Carmen Perez is a 35 y.o. female who presents today after multiple episodes of right-sided weakness and shaking. She describes that she would just feel her right side go numb and feel like a heavy weight was on top of it. It would also become tense and shake during the episodes of numbness. She states that a happen multiple times, each time lasting a few seconds and then resolving. Will she was in the ambulance, she also noted that her head was forcefully turned to the left without her trying to do it. This was during an episode where her right arm and leg were also shaking. She never lost consciousness with any of these episodes, but did say that she had a sense of detachment and disorientation associated with them.   Of note, she stated that it started as sensation of lightheadedness and burning sensation on her back which she has intermittently repeatedly for at least several years. She had discounted it as possibly due to dehydration. This started often nonidentical way, but then progressed to be much more severe.  ROS: A 14 point ROS was performed and is negative except as noted in the HPI.   Past Medical History  Diagnosis Date  . HSV (herpes simplex virus) anogenital infection      Family History  Problem Relation Age of Onset  . Other Neg Hx      Social History:  reports that she has never smoked. She does not have any smokeless tobacco history on file. She reports that she does not drink alcohol or use illicit drugs.   Exam: Current vital signs: BP 122/80 mmHg  Pulse 73  Temp(Src) 98.6 F (37 C) (Oral)  Resp 18  SpO2 100% Vital signs in last 24 hours: Temp:  [97.9 F (36.6 C)-98.6 F (37 C)] 98.6 F (37 C) (03/10 1918) Pulse Rate:  [73-85] 73 (03/10 1918) Resp:  [16-18] 18 (03/10  1918) BP: (108-125)/(64-80) 122/80 mmHg (03/10 1918) SpO2:  [99 %-100 %] 100 % (03/10 1918)   Physical Exam  Constitutional: Appears well-developed and well-nourished.  Psych: Affect appropriate to situation Eyes: No scleral injection HENT: No OP obstrucion Head: Normocephalic.  Cardiovascular: Normal rate and regular rhythm.  Respiratory: Effort normal and breath sounds normal to anterior ascultation GI: Soft.  No distension. There is no tenderness.  Skin: WDI  Neuro: Mental Status: Patient is awake, alert, oriented to person, place, month, year, and situation. Patient is able to give a clear and coherent history. No signs of aphasia or neglect Cranial Nerves: II: Visual Fields are full. Pupils are equal, round, and reactive to light.   III,IV, VI: EOMI without ptosis or diploplia.  V: Facial sensation is symmetric to temperature VII: Facial movement is symmetric.  VIII: hearing is intact to voice X: Uvula elevates symmetrically XI: Shoulder shrug is symmetric. XII: tongue is midline without atrophy or fasciculations.  Motor: Tone is normal. Bulk is normal. 5/5 strength was present in all four extremities.  Sensory: Sensation is symmetric to light touch and temperature in the arms and legs. Deep Tendon Reflexes: 2+ and symmetric in the biceps and patellae.  Plantars: Toes are downgoing bilaterally.  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 It sounds like she may have been having focal seizures for a while now, but this is the first one that registered as a clear event(areas were very mild symptoms). This is not definite, but given that the event today started the same as her  previous events I think it is possible.  I do not think that TIA is at all likely.  Recommendations: 1) EEG 2) Keppra 500 mg twice a day 3) Patient is unable to drive, operate heavy machinery, perform activities at heights or participate in water activities until release by outpatient physician. This was discussed with the patient who expressed understanding.    Roland Rack, MD [facility] [phone]  If 7pm- 7am, please page neurology on call as listed in [place].

## 2016-02-13 ENCOUNTER — Inpatient Hospital Stay (HOSPITAL_COMMUNITY)
Admit: 2016-02-13 | Discharge: 2016-02-13 | Disposition: A | Discharge status: Home / Self Care | Payer: BLUE CROSS/BLUE SHIELD | Attending: Neurology | Admitting: Neurology

## 2016-02-13 DIAGNOSIS — R569 Unspecified convulsions: Principal | ICD-10-CM

## 2016-02-13 DIAGNOSIS — Q283 Other malformations of cerebral vessels: Secondary | ICD-10-CM

## 2016-02-13 LAB — CBC
HCT: 35.4 % — ABNORMAL LOW (ref 36.0–46.0)
Hemoglobin: 11.6 g/dL — ABNORMAL LOW (ref 12.0–15.0)
MCH: 28.7 pg (ref 26.0–34.0)
MCHC: 32.8 g/dL (ref 30.0–36.0)
MCV: 87.6 fL (ref 78.0–100.0)
PLATELETS: 258 10*3/uL (ref 150–400)
RBC: 4.04 MIL/uL (ref 3.87–5.11)
RDW: 12.6 % (ref 11.5–15.5)
WBC: 5.1 10*3/uL (ref 4.0–10.5)

## 2016-02-13 LAB — COMPREHENSIVE METABOLIC PANEL
ALT: 12 U/L — AB (ref 14–54)
AST: 15 U/L (ref 15–41)
Albumin: 3.9 g/dL (ref 3.5–5.0)
Alkaline Phosphatase: 34 U/L — ABNORMAL LOW (ref 38–126)
Anion gap: 12 (ref 5–15)
BUN: 11 mg/dL (ref 6–20)
CHLORIDE: 106 mmol/L (ref 101–111)
CO2: 23 mmol/L (ref 22–32)
CREATININE: 0.8 mg/dL (ref 0.44–1.00)
Calcium: 9.3 mg/dL (ref 8.9–10.3)
GFR calc Af Amer: >60 mL/min (ref >60)
GFR calc non Af Amer: >60 mL/min (ref >60)
Glucose, Bld: 92 mg/dL (ref 65–99)
Potassium: 3.9 mmol/L (ref 3.5–5.1)
SODIUM: 141 mmol/L (ref 135–145)
Total Bilirubin: 0.8 mg/dL (ref 0.3–1.2)
Total Protein: 6.6 g/dL (ref 6.5–8.1)

## 2016-02-13 LAB — TRIGLYCERIDES: TRIGLYCERIDES: 33 mg/dL (ref <150)

## 2016-02-13 MED ORDER — LEVETIRACETAM 500 MG PO TABS: 500.0000 mg | ORAL_TABLET | Freq: Two times a day (BID) | ORAL | Status: DC

## 2016-02-13 NOTE — Progress Notes (Signed)
Patient arrived to 5M09 from the ED at 1915, A/OX4. Patient oriented to room, equipment , and safety measures. MAEW. VSS. Neuro intact. Will monitor overnight.

## 2016-02-13 NOTE — Procedures (Signed)
ELECTROENCEPHALOGRAM REPORT  Patient: Carmen Perez       Room #: [id number]  EEG No. ID: [id number] Age: 35 y.o.        Sex: female Referring Physician: Cathlean Sauer Report Date:  02/13/2016        Interpreting Physician: Anthony Sar  History: Carmen Perez is an 35 y.o. female with new onset right focal seizures, with left cingulate gyrus cavernoma seen on MRI of the brain.  Indications for study:  Rule out seizure activity.  Technique: This is an 18 channel routine scalp EEG performed at the bedside with bipolar and monopolar montages arranged in accordance to the international 10/20 system of electrode placement.   Description: This EEG recording was performed during wakefulness and during drowsiness. Predominant activity during wakefulness consisted of 10 Hz symmetrical alpha rhythm which attenuated well with eye opening. Photic stimulation produced symmetrical occipital driving response. Hyperventilation was not performed. There was mild diffuse nonspecific slowing of cerebral activity during drowsiness. Stage II of sleep was not achieved. No epileptiform discharges were recorded. There was no abnormal slowing of cerebral activity.  Interpretation: This is a normal EEG recording coalescent during drowsiness. No evidence of formal activity was recorded. However, absence of epileptiform activity during an interictal EEG recording does not in and of itself rule out seizure disorder.   Rush Farmer M.D. [facility] [phone]

## 2016-02-13 NOTE — Discharge Instructions (Addendum)
Epilepsy °Epilepsy is a disorder in which a person has repeated seizures over time. A seizure is a release of abnormal electrical activity in the brain. Seizures can cause a change in attention, behavior, or the ability to remain awake and alert (altered mental status). Seizures often involve uncontrollable shaking (convulsions).  °Most people with epilepsy lead normal lives. However, people with epilepsy are at an increased risk of falls, accidents, and injuries. Therefore, it is important to begin treatment right away. °CAUSES  °Epilepsy has many possible causes. Anything that disturbs the normal pattern of brain cell activity can lead to seizures. This may include:  °· Head injury. °· Birth trauma. °· High fever as a child. °· Stroke. °· Bleeding into or around the brain. °· Certain drugs. °· Prolonged low oxygen, such as what occurs after CPR efforts. °· Abnormal brain development. °· Certain illnesses, such as meningitis, encephalitis (brain infection), malaria, and other infections. °· An imbalance of nerve signaling chemicals (neurotransmitters).   °SIGNS AND SYMPTOMS  °The symptoms of a seizure can vary greatly from one person to another. Right before a seizure, you may have a warning (aura) that a seizure is about to occur. An aura may include the following symptoms: °· Fear or anxiety. °· Nausea. °· Feeling like the room is spinning (vertigo). °· Vision changes, such as seeing flashing lights or spots. °Common symptoms during a seizure include: °· Abnormal sensations, such as an abnormal smell or a bitter taste in the mouth.   °· Sudden, general body stiffness.   °· Convulsions that involve rhythmic jerking of the face, arm, or leg on one or both sides.   °· Sudden change in consciousness.   °¨ Appearing to be awake but not responding.   °¨ Appearing to be asleep but cannot be awakened.   °· Grimacing, chewing, lip smacking, drooling, tongue biting, or loss of bowel or bladder control. °After a seizure,  you may feel sleepy for a while.  °DIAGNOSIS  °Your health care provider will ask about your symptoms and take a medical history. Descriptions from any witnesses to your seizures will be very helpful in the diagnosis. A physical exam, including a detailed neurological exam, is necessary. Various tests may be done, such as:  °· An electroencephalogram (EEG). This is a painless test of your brain waves. In this test, a diagram is created of your brain waves. These diagrams can be interpreted by a specialist. °· An MRI of the brain.   °· A CT scan of the brain.   °· A spinal tap (lumbar puncture, LP). °· Blood tests to check for signs of infection or abnormal blood chemistry. °TREATMENT  °There is no cure for epilepsy, but it is generally treatable. Once epilepsy is diagnosed, it is important to begin treatment as soon as possible. For most people with epilepsy, seizures can be controlled with medicines. The following may also be used: °· A pacemaker for the brain (vagus nerve stimulator) can be used for people with seizures that are not well controlled by medicine. °· Surgery on the brain. °For some people, epilepsy eventually goes away. °HOME CARE INSTRUCTIONS  °· Follow your health care provider's recommendations on driving and safety in normal activities. °· Get enough rest. Lack of sleep can cause seizures. °· Only take over-the-counter or prescription medicines as directed by your health care provider. Take any prescribed medicine exactly as directed. °· Avoid any known triggers of your seizures. °· Keep a seizure diary. Record what you recall about any seizure, especially any possible trigger.   °· Make   sure the people you live and work with know that you are prone to seizures. They should receive instructions on how to help you. In general, a witness to a seizure should:   Cushion your head and body.   Turn you on your side.   Avoid unnecessarily restraining you.   Not place anything inside your  mouth.   Call for emergency medical help if there is any question about what has occurred.   Follow up with your health care provider as directed. You may need regular blood tests to monitor the levels of your medicine.  SEEK MEDICAL CARE IF:   You develop signs of infection or other illness. This might increase the risk of a seizure.   You seem to be having more frequent seizures.   Your seizure pattern is changing.  SEEK IMMEDIATE MEDICAL CARE IF:   You have a seizure that does not stop after a few moments.   You have a seizure that causes any difficulty in breathing.   You have a seizure that results in a very severe headache.   You have a seizure that leaves you with the inability to speak or use a part of your body.    This information is not intended to replace advice given to you by your health care provider. Make sure you discuss any questions you have with your health care provider.   Document Released: 11/21/2005 Document Revised: 09/11/2013 Document Reviewed: 07/03/2013 Elsevier Interactive Patient Education 2016 [employer].  Epilepsy Epilepsy is a disorder in which a person has repeated seizures over time. A seizure is a release of abnormal electrical activity in the brain. Seizures can cause a change in attention, behavior, or the ability to remain awake and alert (altered mental status). Seizures often involve uncontrollable shaking (convulsions).  Most people with epilepsy lead normal lives. However, people with epilepsy are at an increased risk of falls, accidents, and injuries. Therefore, it is important to begin treatment right away. CAUSES  Epilepsy has many possible causes. Anything that disturbs the normal pattern of brain cell activity can lead to seizures. This may include:   Head injury.  Birth trauma.  High fever as a child.  Stroke.  Bleeding into or around the brain.  Certain drugs.  Prolonged low oxygen, such as what occurs after  CPR efforts.  Abnormal brain development.  Certain illnesses, such as meningitis, encephalitis (brain infection), malaria, and other infections.  An imbalance of nerve signaling chemicals (neurotransmitters).  SIGNS AND SYMPTOMS  The symptoms of a seizure can vary greatly from one person to another. Right before a seizure, you may have a warning (aura) that a seizure is about to occur. An aura may include the following symptoms:  Fear or anxiety.  Nausea.  Feeling like the room is spinning (vertigo).  Vision changes, such as seeing flashing lights or spots. Common symptoms during a seizure include:  Abnormal sensations, such as an abnormal smell or a bitter taste in the mouth.   Sudden, general body stiffness.   Convulsions that involve rhythmic jerking of the face, arm, or leg on one or both sides.   Sudden change in consciousness.   Appearing to be awake but not responding.   Appearing to be asleep but cannot be awakened.   Grimacing, chewing, lip smacking, drooling, tongue biting, or loss of bowel or bladder control. After a seizure, you may feel sleepy for a while. DIAGNOSIS  Your health care provider will ask about your symptoms and  take a medical history. Descriptions from any witnesses to your seizures will be very helpful in the diagnosis. A physical exam, including a detailed neurological exam, is necessary. Various tests may be done, such as:   An electroencephalogram (EEG). This is a painless test of your brain waves. In this test, a diagram is created of your brain waves. These diagrams can be interpreted by a specialist.  An MRI of the brain.   A CT scan of the brain.   A spinal tap (lumbar puncture, LP).  Blood tests to check for signs of infection or abnormal blood chemistry. TREATMENT  There is no cure for epilepsy, but it is generally treatable. Once epilepsy is diagnosed, it is important to begin treatment as soon as possible. For most people  with epilepsy, seizures can be controlled with medicines. The following may also be used:  A pacemaker for the brain (vagus nerve stimulator) can be used for people with seizures that are not well controlled by medicine.  Surgery on the brain. For some people, epilepsy eventually goes away. HOME CARE INSTRUCTIONS   Follow your health care provider's recommendations on driving and safety in normal activities.  Get enough rest. Lack of sleep can cause seizures.  Only take over-the-counter or prescription medicines as directed by your health care provider. Take any prescribed medicine exactly as directed.  Avoid any known triggers of your seizures.  Keep a seizure diary. Record what you recall about any seizure, especially any possible trigger.   Make sure the people you live and work with know that you are prone to seizures. They should receive instructions on how to help you. In general, a witness to a seizure should:   Cushion your head and body.   Turn you on your side.   Avoid unnecessarily restraining you.   Not place anything inside your mouth.   Call for emergency medical help if there is any question about what has occurred.   Follow up with your health care provider as directed. You may need regular blood tests to monitor the levels of your medicine.  SEEK MEDICAL CARE IF:   You develop signs of infection or other illness. This might increase the risk of a seizure.   You seem to be having more frequent seizures.   Your seizure pattern is changing.  SEEK IMMEDIATE MEDICAL CARE IF:   You have a seizure that does not stop after a few moments.   You have a seizure that causes any difficulty in breathing.   You have a seizure that results in a very severe headache.   You have a seizure that leaves you with the inability to speak or use a part of your body.    This information is not intended to replace advice given to you by your health care provider.  Make sure you discuss any questions you have with your health care provider.   Document Released: 11/21/2005 Document Revised: 09/11/2013 Document Reviewed: 07/03/2013 Elsevier Interactive Patient Education [employer].     no  driving, operate heavy machinery, perform activities at heights or participate in water activities until release by outpatient physician.

## 2016-02-13 NOTE — Discharge Summary (Signed)
Physician Discharge Summary  Carmen Perez [id number] DOB: May 11, 1981 DOA: 02/12/2016  PCP: No primary care provider on file.  Admit date: 02/12/2016 Discharge date: 02/13/2016  Time spent: 25 minutes  Recommendations for Outpatient Follow-up:  1. Discharge home with outpatient referral to neurology. Given contact information to establish care at [facility].   Discharge Diagnoses:  Principal Problem:   Seizures ([place])   Active Problems:   Cerebral cavernoma   Discharge Condition: Fair  Diet recommendation: Regular  There were no vitals filed for this visit.  History of present illness:  35 y.o. female with no significant past medical history presented to the ER at with numbness and weakness involving her right arm and minimally her right leg to starting around 11 AM today. she recalls intermittent symptoms of right lower back pain and dizziness off and on for a long time which she attributed to dehydration. She denies any other symptoms at this time her numbness and weakness involving her right arm which have now resolved. She denies any fevers, rash, visual symptoms. She denies any trauma, no history of seizures. In the emergency room underwent an MRI of her brain which was significant for a 1.5 cm lesion in the left cingulate gyrus which Is likely a cavernoma. Neurology consulted who evaluated the patient and suggested this is likely new onset seizures in the setting of left-sided cavernoma.  admitted to hospitalist service and started on oral Keppra.  Hospital Course:  New onset seizures Likely in the setting of left-sided cavernoma. Appreciate neurology recommendations. Appears that patient was possibly having focal seizures with mild symptoms in the past. MRI brain unremarkable except for left-sided cavernoma. Patient started on oral Keppra 500 mg twice a day. Patient remains stable with no further episodes. EEG done and unremarkable. -Patient is  clinically stable to be discharged home. I have made referral to outpatient neurology. Also given information on [facility] and to establish care over there. -Patient clearly instructed that she cannot drive, operate heavy machinery, perform activities at [facility] of participating water activities until cleared by outpatient physician or neurologist.   Procedures:  MRI brain  EEG  Consultations:  Neurology  Discharge Exam: Filed Vitals:   02/13/16 0500 02/13/16 1027  BP: 89/55 99/50  Pulse: 76 64  Temp: 98.5 F (36.9 C) 98.3 F (36.8 C)  Resp: 16 18    General: Middle aged female not in distress HEENT: No pallor, moist mucosa  chest: clear bilaterally CVS: S1 and S2, no murmurs rub or gallop GI: Soft, nondistended, nontender, bowel sounds present Musculoskeletal: Warm, no edema CNS: Alert and oriented, nonfocal  Discharge Instructions   Discharge Instructions    Ambulatory referral to Neurology    Complete by:  As directed   An appointment is requested in approximately: 3 weeks. ( for seizures)          Current Discharge Medication List    START taking these medications   Details  levETIRAcetam (KEPPRA) 500 MG tablet Take 1 tablet (500 mg total) by mouth 2 (two) times daily. Qty: 60 tablet, Refills: 0      CONTINUE these medications which have NOT CHANGED   Details  ibuprofen (ADVIL,MOTRIN) 200 MG tablet Take 200 mg by mouth every 6 (six) hours as needed for headache.    Multiple Vitamins-Minerals (HM MULTIVITAMIN ADULT GUMMY PO) Take 1 each by mouth daily.       Allergies  Allergen Reactions  . Meat Extract Hives and Other (See  Comments)    Lamb - Red meat   Follow-up Information    Schedule an appointment as soon as possible for a visit with [place].   Contact information:   [address] [place] [phone]       The results of significant diagnostics  from this hospitalization (including imaging, microbiology, ancillary and laboratory) are listed below for reference.    Significant Diagnostic Studies: Mr Kizzie Fantasia Contrast  Mar 12, 2016  CLINICAL DATA:  Right arm numbness, dizziness, and lightheaded. Symptoms began this morning. Some headache. EXAM: MRI HEAD WITHOUT AND WITH CONTRAST TECHNIQUE: Multiplanar, multiecho pulse sequences of the brain and surrounding structures were obtained without and with intravenous contrast. CONTRAST:  15 mL MultiHance COMPARISON:  None. FINDINGS: There is an approximately 1.5 x 1.0 cm focus of marked T1 and T2 hypointensity and marked susceptibility artifact artifact in the left cingulate gyrus consistent with chronic blood products. There is a small amount of T2 hyperintensity centrally within the lesion. There is no surrounding edema or mass effect. No abnormal enhancement is identified. The brain is otherwise normal in appearance. There is no evidence of acute infarct, acute intracranial hemorrhage, midline shift, or extra-axial fluid collection. Ventricles and sulci are normal. Orbits are unremarkable. Paranasal sinuses and mastoid air cells are clear. Major intracranial vascular flow voids are preserved. IMPRESSION: 1. 1.5 cm lesion in the left cingulate gyrus favored to represent a cavernoma. Other potential considerations such as sequelae of prior trauma or remote hemorrhagic ischemia are considered less likely. No evidence of recent hemorrhage. 2. Otherwise unremarkable appearance of the brain. The study was discussed by telephone with Dr. Leonard Schwartz on 12-Mar-2016 at 4:35 pm. Electronically Signed   By: Logan Bores M.D.   On: 12-Mar-2016 16:51    Microbiology: No results found for this or any previous visit (from the past 240 hour(s)).   Labs: Basic Metabolic Panel:  Recent Labs Lab 03/12/16 1511 02/13/16 0534  NA 142 141  K 3.7 3.9  CL 105 106  CO2  --  23  GLUCOSE 94 92  BUN 11 11  CREATININE  0.60 0.80  CALCIUM  --  9.3   Liver Function Tests:  Recent Labs Lab 02/13/16 0534  AST 15  ALT 12*  ALKPHOS 34*  BILITOT 0.8  PROT 6.6  ALBUMIN 3.9   No results for input(s): LIPASE, AMYLASE in the last 168 hours. No results for input(s): AMMONIA in the last 168 hours. CBC:  Recent Labs Lab 03/12/16 1511 02/13/16 0534  WBC  --  5.1  HGB 12.6 11.6*  HCT 37.0 35.4*  MCV  --  87.6  PLT  --  258   Cardiac Enzymes: No results for input(s): CKTOTAL, CKMB, CKMBINDEX, TROPONINI in the last 168 hours. BNP: BNP (last 3 results) No results for input(s): BNP in the last 8760 hours.  ProBNP (last 3 results) No results for input(s): PROBNP in the last 8760 hours.  CBG: No results for input(s): GLUCAP in the last 168 hours.     Signed:  Louellen Molder MD.  [facility] 02/13/2016, 2:34 PM

## 2016-02-13 NOTE — Progress Notes (Signed)
Pt discharged per orders from MD. Pt educated on discharge instructions. All questions and concerns were addressed. Pt verbalized understanding of instructions. IV previously removed before discharge. Pt exited hospital via ambulation.

## 2016-02-13 NOTE — Progress Notes (Signed)
EEG Completed; Results Pending  

## 2016-02-15 LAB — HEMOGLOBIN A1C
HEMOGLOBIN A1C: 5.3 % (ref 4.8–5.6)
MEAN PLASMA GLUCOSE: 105 mg/dL

## 2016-02-16 MED ORDER — GADOBENATE DIMEGLUMINE 529 MG/ML IV SOLN
15.0000 mL | Freq: Once | INTRAVENOUS | Status: AC | PRN
  Administered 2016-02-12: 15 mL via INTRAVENOUS

## 2016-02-17 ENCOUNTER — Inpatient Hospital Stay: Payer: BLUE CROSS/BLUE SHIELD | Admitting: Family Medicine

## 2016-02-26 ENCOUNTER — Ambulatory Visit: Payer: BLUE CROSS/BLUE SHIELD | Attending: Physician Assistant | Admitting: Physician Assistant

## 2016-02-26 ENCOUNTER — Encounter: Payer: Self-pay | Admitting: Clinical

## 2016-02-26 VITALS — BP 121/75 | HR 82 | Temp 98.2°F | Resp 15 | Ht 68.5 in | Wt 152.0 lb

## 2016-02-26 DIAGNOSIS — G939 Disorder of brain, unspecified: Secondary | ICD-10-CM

## 2016-02-26 DIAGNOSIS — Q283 Other malformations of cerebral vessels: Secondary | ICD-10-CM

## 2016-02-26 DIAGNOSIS — R569 Unspecified convulsions: Secondary | ICD-10-CM | POA: Diagnosis not present

## 2016-02-26 DIAGNOSIS — G9389 Other specified disorders of brain: Secondary | ICD-10-CM | POA: Diagnosis not present

## 2016-02-26 DIAGNOSIS — Z79899 Other long term (current) drug therapy: Secondary | ICD-10-CM | POA: Diagnosis not present

## 2016-02-26 DIAGNOSIS — Z888 Allergy status to other drugs, medicaments and biological substances status: Secondary | ICD-10-CM | POA: Diagnosis not present

## 2016-02-26 DIAGNOSIS — B009 Herpesviral infection, unspecified: Secondary | ICD-10-CM | POA: Diagnosis not present

## 2016-02-26 MED ORDER — LEVETIRACETAM 500 MG PO TABS: 500.0000 mg | ORAL_TABLET | Freq: Two times a day (BID) | ORAL | Status: DC

## 2016-02-26 NOTE — Progress Notes (Signed)
Patient ID: Carmen Perez, female   DOB: 1981-07-10, 35 y.o.   MRN: [id number]   Carmen Perez, is a 35 y.o. female  [id number]  [id number]  DOB - 1981/02/02  Chief Complaint  Patient presents with  . Establish Care        Subjective:   Carmen Perez is a 35 y.o. female here today for a follow up after a hospital admission for neurological s/sx and was diagnosed with Cerebral Cavernoma and seizure activity.  She needs a referral to neurology. Patient has No headache, No chest pain, No abdominal pain - No Nausea, No new weakness tingling or numbness, No Cough - SOB.  No problems updated.  ALLERGIES: Allergies  Allergen Reactions  . Meat Extract Hives and Other (See Comments)    Lamb - Red meat    PAST MEDICAL HISTORY: Past Medical History  Diagnosis Date  . HSV (herpes simplex virus) anogenital infection     MEDICATIONS AT HOME: Prior to Admission medications   Medication Sig Start Date End Date Taking? Authorizing Provider  ibuprofen (ADVIL,MOTRIN) 200 MG tablet Take 200 mg by mouth every 6 (six) hours as needed for headache.   Yes Historical Provider, MD  levETIRAcetam (KEPPRA) 500 MG tablet Take 1 tablet (500 mg total) by mouth 2 (two) times daily. 02/26/16  Yes Dionne Bucy Montrell Cessna, PA-C  Multiple Vitamins-Minerals (HM MULTIVITAMIN ADULT GUMMY PO) Take 1 each by mouth daily.   Yes Historical Provider, MD     Objective:   Filed Vitals:   02/26/16 0914  BP: 121/75  Pulse: 82  Temp: 98.2 F (36.8 C)  Resp: 15  Height: 5' 8.5" (1.74 m)  Weight: 152 lb (68.947 kg)  SpO2: 100%    Exam General appearance : Awake, alert, not in any distress. Speech Clear. Not toxic looking HEENT: Atraumatic and Normocephalic, pupils equally reactive to light and accomodation.  No nystagmus. PEERLA.  EOM in tact.  Neck: supple, no JVD. No cervical lymphadenopathy.  Chest:Good air entry bilaterally, no added sounds  CVS: S1 S2 regular, no murmurs. . Extremities: B/L Lower  Ext shows no edema, both legs are warm to touch Neurology: Awake alert, and oriented X 3, CN II-XII intact, Non focal.  Finger to nose.  Heel to shin.  No rhomberg.  Skin:No Rash  Data Review Lab Results  Component Value Date   HGBA1C 5.3 02/12/2016     Assessment & Plan   1. Lesion of brain/Cerebral cavernoma No new s/sx.  Continue Keppra.  - Ambulatory referral to Neurology  2. Seizures ([place]) - Ambulatory referral to Neurology  Reviewed labs and hospital course with Dr. Doreene Burke.   Patient have been counseled extensively about nutrition and exercise F/up in 2 months  The patient was given clear instructions to go to ER or return to medical center if symptoms don't improve, worsen or new problems develop. The patient verbalized understanding. The patient was told to call to get lab results if they haven't heard anything in the next week.     Freeman Caldron, PA-C [facility] [place], [place]   02/26/2016, 10:31 AM

## 2016-02-26 NOTE — Progress Notes (Signed)
Depression screen [facility] 2/9 02/26/2016  Decreased Interest 0  Down, Depressed, Hopeless 0  PHQ - 2 Score 0  *PHQ9: 2(1, feeling tired or having little energy/ 1, poor appetite)  GAD 7 : Generalized Anxiety Score 02/26/2016  Nervous, Anxious, on Edge 0  Control/stop worrying 0  Worry too much - different things 0  Trouble relaxing 0  Restless 0  Easily annoyed or irritable 0  Afraid - awful might happen 0  Total GAD 7 Score 0

## 2016-02-26 NOTE — Progress Notes (Signed)
Patient here to establish care She recently diagnosed with a cavernoma  Needs a referral to neuro

## 2016-03-02 ENCOUNTER — Ambulatory Visit (INDEPENDENT_AMBULATORY_CARE_PROVIDER_SITE_OTHER): Payer: BLUE CROSS/BLUE SHIELD | Admitting: Family Medicine

## 2016-03-02 VITALS — BP 114/66 | HR 101 | Temp 98.4°F | Resp 16 | Ht 68.0 in | Wt 151.0 lb

## 2016-03-02 DIAGNOSIS — Q283 Other malformations of cerebral vessels: Secondary | ICD-10-CM | POA: Diagnosis not present

## 2016-03-02 DIAGNOSIS — Z79899 Other long term (current) drug therapy: Secondary | ICD-10-CM | POA: Diagnosis not present

## 2016-03-02 DIAGNOSIS — J029 Acute pharyngitis, unspecified: Secondary | ICD-10-CM

## 2016-03-02 DIAGNOSIS — R569 Unspecified convulsions: Secondary | ICD-10-CM | POA: Diagnosis not present

## 2016-03-02 LAB — POCT CBC
Granulocyte percent: 80.8 %G — AB (ref 37–80)
HCT, POC: 34.5 % — AB (ref 37.7–47.9)
Hemoglobin: 11.9 g/dL — AB (ref 12.2–16.2)
LYMPH, POC: 1.6 (ref 0.6–3.4)
MCH: 29.6 pg (ref 27–31.2)
MCHC: 34.6 g/dL (ref 31.8–35.4)
MCV: 85.5 fL (ref 80–97)
MID (CBC): 0.8 (ref 0–0.9)
MPV: 6.6 fL (ref 0–99.8)
POC Granulocyte: 10.3 — AB (ref 2–6.9)
POC LYMPH PERCENT: 12.8 %L (ref 10–50)
POC MID %: 6.4 % (ref 0–12)
Platelet Count, POC: 269 10*3/uL (ref 142–424)
RBC: 4.03 M/uL — AB (ref 4.04–5.48)
RDW, POC: 12.3 %
WBC: 12.7 10*3/uL — AB (ref 4.6–10.2)

## 2016-03-02 LAB — POCT SEDIMENTATION RATE: POCT SED RATE: 30 mm/hr — AB (ref 0–22)

## 2016-03-02 LAB — POCT RAPID STREP A (OFFICE): RAPID STREP A SCREEN: NEGATIVE

## 2016-03-02 MED ORDER — AMOXICILLIN-POT CLAVULANATE 875-125 MG PO TABS: 1.0000 | ORAL_TABLET | Freq: Two times a day (BID) | ORAL | Status: DC

## 2016-03-02 NOTE — Patient Instructions (Addendum)
IF you received an x-ray today, you will receive an invoice from [facility]. Please contact [facility] at [phone] with questions or concerns regarding your invoice.   IF you received labwork today, you will receive an invoice from [employer]. Please contact [facility] at [phone] with questions or concerns regarding your invoice.   Our billing staff will not be able to assist you with questions regarding bills from these companies.  You will be contacted with the lab results as soon as they are available. The fastest way to get your results is to activate your My Chart account. Instructions are located on the last page of this paperwork. If you have not heard from [place] regarding the results in 2 weeks, please contact this office.    Pharyngitis Pharyngitis is redness, pain, and swelling (inflammation) of your pharynx.  CAUSES  Pharyngitis is usually caused by infection. Most of the time, these infections are from viruses (viral) and are part of a cold. However, sometimes pharyngitis is caused by bacteria (bacterial). Pharyngitis can also be caused by allergies. Viral pharyngitis may be spread from person to person by coughing, sneezing, and personal items or utensils (cups, forks, spoons, toothbrushes). Bacterial pharyngitis may be spread from person to person by more intimate contact, such as kissing.  SIGNS AND SYMPTOMS  Symptoms of pharyngitis include:   Sore throat.   Tiredness (fatigue).   Low-grade fever.   Headache.  Joint pain and muscle aches.  Skin rashes.  Swollen lymph nodes.  Plaque-like film on throat or tonsils (often seen with bacterial pharyngitis). DIAGNOSIS  Your health care provider will ask you questions about your illness and your symptoms. Your medical history, along with a physical exam, is often all that is needed to diagnose pharyngitis. Sometimes, a rapid strep test is done. Other lab tests may  also be done, depending on the suspected cause.  TREATMENT  Viral pharyngitis will usually get better in 3-4 days without the use of medicine. Bacterial pharyngitis is treated with medicines that kill germs (antibiotics).  HOME CARE INSTRUCTIONS   Drink enough water and fluids to keep your urine clear or pale yellow.   Only take over-the-counter or prescription medicines as directed by your health care provider:   If you are prescribed antibiotics, make sure you finish them even if you start to feel better.   Do not take aspirin.   Get lots of rest.   Gargle with 8 oz of salt water ( tsp of salt per 1 qt of water) as often as every 1-2 hours to soothe your throat.   Throat lozenges (if you are not at risk for choking) or sprays may be used to soothe your throat. SEEK MEDICAL CARE IF:   You have large, tender lumps in your neck.  You have a rash.  You cough up green, yellow-brown, or bloody spit. SEEK IMMEDIATE MEDICAL CARE IF:   Your neck becomes stiff.  You drool or are unable to swallow liquids.  You vomit or are unable to keep medicines or liquids down.  You have severe pain that does not go away with the use of recommended medicines.  You have trouble breathing (not caused by a stuffy nose). MAKE SURE YOU:   Understand these instructions.  Will watch your condition.  Will get help right away if you are not doing well or get worse.   This information is not intended to replace advice given to you by your health care  provider. Make sure you discuss any questions you have with your health care provider.   Document Released: 11/21/2005 Document Revised: 09/11/2013 Document Reviewed: 07/29/2013 Elsevier Interactive Patient Education [employer].

## 2016-03-02 NOTE — Progress Notes (Signed)
Subjective:  This chart was scribed for Delman Cheadle MD, by Tamsen Roers, at [facility].  This patient was seen in room 3 and the patient's care was started at 11:20 AM.   Chief Complaint  Patient presents with  . Seizures  . Medication Reaction    Keppra  . Fever  . Chills     Patient ID: Carmen Perez, female    DOB: 1981/08/27, 35 y.o.   MRN: [id number]  HPI HPI Comments: Carmen Perez is a 35 y.o. female who presents to the [facility] complaining of a sore throat (which she states is the worst she has ever had) onset earlier this week.  She has associated symptoms of fever/chills. Patient is worried that she may be having a side effect to the [place].  She denies any rashes.  She does not have her tonsils.  She was diagnosed with the flu about 2 weeks ago at [place]. Her father was diagnosed with pneumonia recently and her infant (102 year old) had pink eye recently as well. Patient works at [employer] and [employer].  She states that her mood is doing as well as it can with all the things going on this month.   ----- She was seen at [facility] 5 days ago by [employer].  She was recently admitted to the hospital overnight 3/10- 3/11 diagnosed with new onset seizure with left sided cavernoma.  She was started on Keppra 500 twice a day and recommended to follow up with neurology.   She has not yet been able to go into Neurology as they have not yet scheduled her appointment.    Patient Active Problem List   Diagnosis Date Noted  . Cerebral cavernoma 02/12/2016  . Seizures ([place]) 02/12/2016  . Postpartum care following vaginal delivery (6/26) 05/30/2012   Past Medical History  Diagnosis Date  . HSV (herpes simplex virus) anogenital infection    Past Surgical History  Procedure Laterality Date  . Tonsillectomy    . Knee dislocation surgery     Allergies  Allergen Reactions  . Meat  Extract Hives and Other (See Comments)    Lamb - Red meat   Prior to Admission medications   Medication Sig Start Date End Date Taking? Authorizing Provider  ibuprofen (ADVIL,MOTRIN) 200 MG tablet Take 200 mg by mouth every 6 (six) hours as needed for headache.   Yes Historical Provider, MD  levETIRAcetam (KEPPRA) 500 MG tablet Take 1 tablet (500 mg total) by mouth 2 (two) times daily. 02/26/16  Yes Dionne Bucy McClung, PA-C  Multiple Vitamins-Minerals (HM MULTIVITAMIN ADULT GUMMY PO) Take 1 each by mouth daily.   Yes Historical Provider, MD   Social History   Social History  . Marital Status: Married    Spouse Name: N/A  . Number of Children: N/A  . Years of Education: N/A   Occupational History  . Not on file.   Social History Main Topics  . Smoking status: Never Smoker   . Smokeless tobacco: Not on file  . Alcohol Use: No  . Drug Use: No  . Sexual Activity: Yes   Other Topics Concern  . Not on file   Social History Narrative   Depression screen [facility] 2/9 03/02/2016 02/26/2016  Decreased Interest 0 0  Down, Depressed, Hopeless 0 0  PHQ - 2 Score 0 0      Review of Systems  Constitutional: Positive  for fever and chills. Negative for unexpected weight change.  HENT: Positive for sore throat. Negative for congestion, ear pain, postnasal drip, rhinorrhea, sinus pressure, trouble swallowing and voice change.   Skin: Negative for rash.  Neurological: Positive for seizures. Negative for weakness and numbness.  Hematological: Negative for adenopathy. Does not bruise/bleed easily.  Psychiatric/Behavioral: Negative for behavioral problems, confusion, dysphoric mood, decreased concentration and agitation. The patient is not nervous/anxious.        Objective:   Physical Exam  Constitutional: She is oriented to person, place, and time.  HENT:  Head: Normocephalic and atraumatic.  Mouth/Throat: No oropharyngeal exudate.   bright red erythema-throat  Eyes: Pupils are equal, round,  and reactive to light.  Neck: Normal range of motion. No thyromegaly present.  Cardiovascular: Normal rate, regular rhythm, S1 normal and S2 normal.  Exam reveals no friction rub.   No murmur heard. Tachycardic.   Pulmonary/Chest: Effort normal and breath sounds normal. No respiratory distress. She has no wheezes. She has no rales. She exhibits no tenderness.  Lymphadenopathy:    She has no cervical adenopathy.  Neurological: She is alert and oriented to person, place, and time.  Psychiatric: She has a normal mood and affect. Her behavior is normal.      Filed Vitals:   03/02/16 1018  BP: 114/66  Pulse: 101  Temp: 98.4 F (36.9 C)  TempSrc: Oral  Resp: 16  Height: 5\' 8"  (1.727 m)  Weight: 151 lb (68.493 kg)  SpO2: 96%   Results for orders placed or performed in visit on 03/02/16  POCT rapid strep A  Result Value Ref Range   Rapid Strep A Screen Negative Negative  POCT CBC  Result Value Ref Range   WBC 12.7 (A) 4.6 - 10.2 K/uL   Lymph, poc 1.6 0.6 - 3.4   POC LYMPH PERCENT 12.8 10 - 50 %L   MID (cbc) 0.8 0 - 0.9   POC MID % 6.4 0 - 12 %M   POC Granulocyte 10.3 (A) 2 - 6.9   Granulocyte percent 80.8 (A) 37 - 80 %G   RBC 4.03 (A) 4.04 - 5.48 M/uL   Hemoglobin 11.9 (A) 12.2 - 16.2 g/dL   HCT, POC 34.5 (A) 37.7 - 47.9 %   MCV 85.5 80 - 97 fL   MCH, POC 29.6 27 - 31.2 pg   MCHC 34.6 31.8 - 35.4 g/dL   RDW, POC 12.3 %   Platelet Count, POC 269 142 - 424 K/uL   MPV 6.6 0 - 99.8 fL       Assessment & Plan:   1. Cerebral cavernoma   2. Seizures ([place])   3. High risk medication use   4. Acute pharyngitis, unspecified etiology   Pt concerned pharyngitis could be due to starting Keppra 2 wks prior - concerned about potential immunosuppressive side effects - so reassuring that white count is elevated c/w co-incidental bacterial etiology.   [place] neuro called pt during her OV today and sched her for initial neurology c/s in 5d.  Orders Placed This Encounter  Procedures    . Culture, Group A Strep    Order Specific Question:  Source    Answer:  throat  . Ambulatory referral to Neurology    Referral Priority:  Urgent    Referral Type:  Consultation    Referral Reason:  Hospital Follow-Up    Requested Specialty:  Neurology    Number of Visits Requested:  1  . POCT rapid strep  A  . POCT CBC  . POCT SEDIMENTATION RATE    Meds ordered this encounter  Medications  . amoxicillin-clavulanate (AUGMENTIN) 875-125 MG tablet    Sig: Take 1 tablet by mouth 2 (two) times daily.    Dispense:  20 tablet    Refill:  0    I personally performed the services described in this documentation, which was scribed in my presence. The recorded information has been reviewed and considered, and addended by me as needed.  Delman Cheadle, MD MPH

## 2016-03-03 LAB — CULTURE, GROUP A STREP: Organism ID, Bacteria: NORMAL

## 2016-03-07 ENCOUNTER — Ambulatory Visit (INDEPENDENT_AMBULATORY_CARE_PROVIDER_SITE_OTHER): Payer: BLUE CROSS/BLUE SHIELD | Admitting: Neurology

## 2016-03-07 ENCOUNTER — Encounter: Payer: Self-pay | Admitting: Neurology

## 2016-03-07 VITALS — BP 116/72 | HR 77 | Ht 67.0 in | Wt 152.0 lb

## 2016-03-07 DIAGNOSIS — G40109 Localization-related (focal) (partial) symptomatic epilepsy and epileptic syndromes with simple partial seizures, not intractable, without status epilepticus: Secondary | ICD-10-CM | POA: Diagnosis not present

## 2016-03-07 DIAGNOSIS — Q283 Other malformations of cerebral vessels: Secondary | ICD-10-CM | POA: Diagnosis not present

## 2016-03-07 MED ORDER — LEVETIRACETAM 500 MG PO TABS: 500.0000 mg | ORAL_TABLET | Freq: Two times a day (BID) | ORAL | Status: DC

## 2016-03-07 NOTE — Patient Instructions (Signed)
1. Continue Keppra 500mg  twice a day 2. Refer to Neurosurgery for cavernoma 3. Follow-up in 6 months, call for any change in symptoms  Seizure Precautions: 1. If medication has been prescribed for you to prevent seizures, take it exactly as directed.  Do not stop taking the medicine without talking to your doctor first, even if you have not had a seizure in a long time.   2. Avoid activities in which a seizure would cause danger to yourself or to others.  Don't operate dangerous machinery, swim alone, or climb in high or dangerous places, such as on ladders, roofs, or girders.  Do not drive unless your doctor says you may.  3. If you have any warning that you may have a seizure, lay down in a safe place where you can't hurt yourself.    4.  No driving for 6 months from last seizure, as per [facility].   Please refer to the following link on the [place] website for more information: http://[email or web address].org/[email or web address]/driving/drivingu.cfm    5.  Maintain good sleep hygiene. Avoid alcohol.  6.  Notify your neurology if you are planning pregnancy or if you become pregnant.  7.  Contact your doctor if you have any problems that may be related to the medicine you are taking.  8.  Call 911 and bring the patient back to the ED if:        A.  The seizure lasts longer than 5 minutes.       B.  The patient doesn't awaken shortly after the seizure  C.  The patient has new problems such as difficulty seeing, speaking or moving  D.  The patient was injured during the seizure  E.  The patient has a temperature over 102 F (39C)  F.  The patient vomited and now is having trouble breathing

## 2016-03-07 NOTE — Progress Notes (Signed)
NEUROLOGY CONSULTATION NOTE  BRONX XING MRN: [id number] DOB: 1981/01/03  Referring provider: Dr. Lamar Blinks Primary care provider: Dr. Lamar Blinks  Reason for consult:  New onset seizure  Dear Dr Lorelei Pont:  Thank you for your kind referral of Carmen Perez for consultation of the above symptoms. Although her history is well known to you, please allow me to reiterate it for the purpose of our medical record. Records and images were personally reviewed where available.  HISTORY OF PRESENT ILLNESS: This is a pleasant 35 year old left-handed woman with no significant past medical history presenting after new onset seizure last 02/12/2016. She started feeling lightheaded and had a cramping pain in the right flank region. She reports having these burning/crampy pains with lightheadedness occurring around 2 times a year for the past 10 years. They would usually resolve after she would hydrate herself, so she went to get water, but became more lightheaded where she could barely walk. She sat down then lost all feeling on her right arm from the collar bone down to all her fingers. She had difficulty lifting her right arm. She denied any clonic activity at that time. Over the course of 45 minutes, symptoms would come in a cycle with right flank burning followed by numbness in her arm for a minute, then go away where she could again move her arm normally. She noticed she could not focus on things. Due to worsening symptoms, EMS was called, and in the ambulance she had an episode where she reported her head was forcefully turned to the left side and she could not straighten it. Her right leg was jerking. She could talk and comprehend the entire time. While she was sitting in the ER for 2 hours, she had the recurrent cyclical burning sensation over the right flank followed by right arm numbness lasting 1 minute occurring every 8 minutes. She denied any confusion but on ER notes she reported a  sense of detachment and disorientation while in the ambulance. She denies any olfactory/gustatory hallucinations, deja vu, rising epigastric sensation, previous episodes of gaps in time or staring/unresponsiveness. In the ER, CBC and CMP were unremarkable. She had an MRI brain with and without contrast which I personally reviewed, there was a 1.5 x 1.0 cm focus of T1 and T2 hypointensity and susceptibility artifact in the left cingulate gyrus with a small amount of T2 hyperintensity centrally within the lesion. No surrounding edema or mass effect, no abnormal enhancement. Hippocampi symmetric with no abnormal signal or enhancement. Lesion in the cingulate gyrus was favored to represent a cavernoma. No evidence of recent hemorrhage. Her wake and drowsy EEG was normal. She was discharged home on Keppra 500mg  BID and denies any further similar symptoms since 02/12/16. She is tolerating Keppra without any significant side effects.   Epilepsy Risk Factors:  Left cingulate gyrus lesion suggestive of cavernoma. Otherwise she had a normal birth and early development.  There is no history of febrile convulsions, CNS infections such as meningitis/encephalitis, significant traumatic brain injury, neurosurgical procedures, or family history of seizures.  PAST MEDICAL HISTORY: Past Medical History  Diagnosis Date  . HSV (herpes simplex virus) anogenital infection   . Seizures ([place])   . Post partum thyroiditis     PAST SURGICAL HISTORY: Past Surgical History  Procedure Laterality Date  . Tonsillectomy    . Knee dislocation surgery Left     MEDICATIONS: Current Outpatient Prescriptions on File Prior to Visit  Medication Sig Dispense Refill  .  amoxicillin-clavulanate (AUGMENTIN) 875-125 MG tablet Take 1 tablet by mouth 2 (two) times daily. 20 tablet 0  . ibuprofen (ADVIL,MOTRIN) 200 MG tablet Take 200 mg by mouth every 6 (six) hours as needed for headache.    . levETIRAcetam (KEPPRA) 500 MG tablet Take 1  tablet (500 mg total) by mouth 2 (two) times daily. 60 tablet 2  . Multiple Vitamins-Minerals (HM MULTIVITAMIN ADULT GUMMY PO) Take 1 each by mouth daily.     No current facility-administered medications on file prior to visit.    ALLERGIES: Allergies  Allergen Reactions  . Meat Extract Hives and Other (See Comments)    Lamb - Red meat    FAMILY HISTORY: Family History  Problem Relation Age of Onset  . Other Neg Hx     SOCIAL HISTORY: Social History   Social History  . Marital Status: Married    Spouse Name: N/A  . Number of Children: N/A  . Years of Education: N/A   Occupational History  . Not on file.   Social History Main Topics  . Smoking status: Never Smoker   . Smokeless tobacco: Not on file  . Alcohol Use: No  . Drug Use: No  . Sexual Activity: Yes   Other Topics Concern  . Not on file   Social History Narrative    REVIEW OF SYSTEMS: Constitutional: No fevers, chills, or sweats, no generalized fatigue, change in appetite Eyes: No visual changes, double vision, eye pain Ear, nose and throat: No hearing loss, ear pain, nasal congestion, sore throat Cardiovascular: No chest pain, palpitations Respiratory:  No shortness of breath at rest or with exertion, wheezes GastrointestinaI: No nausea, vomiting, diarrhea, abdominal pain, fecal incontinence Genitourinary:  No dysuria, urinary retention or frequency Musculoskeletal:  No neck pain, back pain Integumentary: No rash, pruritus, skin lesions Neurological: as above Psychiatric: No depression, insomnia, anxiety Endocrine: No palpitations, fatigue, diaphoresis, mood swings, change in appetite, change in weight, increased thirst Hematologic/Lymphatic:  No anemia, purpura, petechiae. 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 Vascular: No carotid bruits. Skin/Extremities: No rash, no edema Neurological Exam: Mental status: alert and oriented to person, place, and time, no dysarthria or aphasia, Fund of knowledge is appropriate.  Recent and remote memory are intact. 3/3 delayed recall. Attention and concentration are normal.    Able to name objects and repeat phrases. Cranial nerves: CN I: not tested CN II: pupils equal, round and reactive to light, visual fields intact, fundi unremarkable. CN III, IV, VI:  full range of motion, no nystagmus, no ptosis CN V: facial sensation intact CN VII: upper and lower face symmetric CN VIII: hearing intact to finger rub CN IX, X: gag intact, uvula midline CN XI: sternocleidomastoid and trapezius muscles intact CN XII: tongue midline Bulk & Tone: normal, no fasciculations. Motor: 5/5 throughout with no pronator drift. Sensation: intact to light touch, cold, pin, vibration and joint position sense.  No extinction to double simultaneous stimulation.  Romberg test negative Deep Tendon Reflexes: +2 throughout, no ankle  clonus Plantar responses: downgoing bilaterally Cerebellar: no incoordination on finger to nose, heel to shin. No dysdiadochokinesia Gait: narrow-based and steady, able to tandem walk adequately. Tremor: none  IMPRESSION: This is a pleasant 35 year old left-handed woman who presented with recurrent stereotyped episodes where she describes a burning sensation in her right flank, followed by right arm numbness and weakness. She was having multiple episodes on  02/12/16, at one point she had right leg clonic activity and head deviation to the left. MRI brain showed a left cingulate gyrus lesion suggestive of a cavernoma. Routine EEG normal. Symptoms suggestive of focal seizures arising from left-sided lesion. She is now on Keppra 500mg  BID with no further seizures or side effects. It is interesting she has been having similar milder episodes of right flank burning with lightheadedness for the past 10 years. We discussed the natural history and prognosis of cavernomas and seizures. Continue current dose of Keppra, if seizures recur, we will plan to uptitrate dose. She will be referred to Neurosurgery for evaluation and establish care for cavernoma. [place] driving laws were discussed with the patient, and she knows to stop driving after a seizure, until 6 months seizure-free. She will follow-up in 6 months and knows to call for any changes.   Thank you for allowing me to participate in the care of this patient. Please do not hesitate to call for any questions or concerns.   Ellouise Newer, M.D.  CC: Dr. Lorelei Pont

## 2016-03-08 ENCOUNTER — Encounter: Payer: Self-pay | Admitting: Neurology

## 2016-03-09 ENCOUNTER — Encounter: Payer: Self-pay | Admitting: Family Medicine

## 2016-03-09 DIAGNOSIS — G40109 Localization-related (focal) (partial) symptomatic epilepsy and epileptic syndromes with simple partial seizures, not intractable, without status epilepticus: Secondary | ICD-10-CM | POA: Insufficient documentation

## 2016-03-18 ENCOUNTER — Telehealth: Payer: BLUE CROSS/BLUE SHIELD | Admitting: Family

## 2016-03-18 DIAGNOSIS — H1089 Other conjunctivitis: Secondary | ICD-10-CM

## 2016-03-18 DIAGNOSIS — H109 Unspecified conjunctivitis: Secondary | ICD-10-CM

## 2016-03-18 DIAGNOSIS — A499 Bacterial infection, unspecified: Secondary | ICD-10-CM

## 2016-03-18 MED ORDER — OFLOXACIN 0.3 % OP SOLN: 1.0000 [drp] | Freq: Four times a day (QID) | OPHTHALMIC | Status: AC

## 2016-03-18 NOTE — Progress Notes (Signed)

## 2016-03-27 ENCOUNTER — Telehealth: Payer: BLUE CROSS/BLUE SHIELD | Admitting: Family

## 2016-03-27 DIAGNOSIS — B9689 Other specified bacterial agents as the cause of diseases classified elsewhere: Secondary | ICD-10-CM

## 2016-03-27 DIAGNOSIS — A499 Bacterial infection, unspecified: Secondary | ICD-10-CM

## 2016-03-27 DIAGNOSIS — J329 Chronic sinusitis, unspecified: Secondary | ICD-10-CM

## 2016-03-27 MED ORDER — FLUTICASONE PROPIONATE 50 MCG/ACT NA SUSP: 1.0000 | Freq: Two times a day (BID) | NASAL | Status: DC

## 2016-03-27 NOTE — Progress Notes (Signed)
We are sorry that you are not feeling well.  Here is how we plan to help!  Based on what you have shared with me it looks like you have sinusitis.  Sinusitis is inflammation and infection in the sinus cavities of the head.  Based on your presentation I believe you most likely have Acute Bacterial Sinusitis.  This is an infection caused by bacteria and is treated with antibiotics. Based on our telephone conversation, this may be resolving and/or may have been viral. I have prescribed: Fluticasone nasal spray: one spraysin each nostril twice a day) You may use an oral decongestant such as Mucinex D or if you have glaucoma or high blood pressure use plain Mucinex. Saline nasal spray help and can safely be used as often as needed for congestion.  If you develop worsening sinus pain, fever or notice severe headache and vision changes, or if symptoms are not better after completion of antibiotic, please schedule an appointment with a health care provider.    Sinus infections are not as easily transmitted as other respiratory infection, however we still recommend that you avoid close contact with loved ones, especially the very young and elderly.  Remember to wash your hands thoroughly throughout the day as this is the number one way to prevent the spread of infection!  Home Care:  Only take medications as instructed by your medical team.  Complete the entire course of an antibiotic.  Do not take these medications with alcohol.  A steam or ultrasonic humidifier can help congestion.  You can place a towel over your head and breathe in the steam from hot water coming from a faucet.  Avoid close contacts especially the very young and the elderly.  Cover your mouth when you cough or sneeze.  Always remember to wash your hands.  Get Help Right Away If:  You develop worsening fever or sinus pain.  You develop a severe head ache or visual changes.  Your symptoms persist after you have completed your  treatment plan.  Make sure you  Understand these instructions.  Will watch your condition.  Will get help right away if you are not doing well or get worse.  Your e-visit answers were reviewed by a board certified advanced clinical practitioner to complete your personal care plan.  Depending on the condition, your plan could have included both over the counter or prescription medications.  If there is a problem please reply  once you have received a response from your provider.  Your safety is important to [place].  If you have drug allergies check your prescription carefully.    You can use MyChart to ask questions about today's visit, request a non-urgent call back, or ask for a work or school excuse for 24 hours related to this e-Visit. If it has been greater than 24 hours you will need to follow up with your provider, or enter a new e-Visit to address those concerns.  You will get an e-mail in the next two days asking about your experience.  I hope that your e-visit has been valuable and will speed your recovery. Thank you for using e-visits.

## 2016-05-04 ENCOUNTER — Telehealth: Payer: Self-pay | Admitting: *Deleted

## 2016-05-04 ENCOUNTER — Encounter: Payer: Self-pay | Admitting: *Deleted

## 2016-05-04 NOTE — Telephone Encounter (Signed)
Pre-Visit Call completed with patient and chart updated.   Pre-Visit Info documented in Specialty Comments under [employer].    

## 2016-05-05 ENCOUNTER — Encounter: Payer: Self-pay | Admitting: Family Medicine

## 2016-05-05 ENCOUNTER — Ambulatory Visit (INDEPENDENT_AMBULATORY_CARE_PROVIDER_SITE_OTHER): Payer: BLUE CROSS/BLUE SHIELD | Admitting: Family Medicine

## 2016-05-05 VITALS — BP 100/65 | HR 77 | Temp 98.3°F | Ht 67.25 in | Wt 154.2 lb

## 2016-05-05 DIAGNOSIS — Z87898 Personal history of other specified conditions: Secondary | ICD-10-CM

## 2016-05-05 DIAGNOSIS — Z8669 Personal history of other diseases of the nervous system and sense organs: Secondary | ICD-10-CM | POA: Diagnosis not present

## 2016-05-05 DIAGNOSIS — G4089 Other seizures: Secondary | ICD-10-CM

## 2016-05-05 DIAGNOSIS — Z87892 Personal history of anaphylaxis: Secondary | ICD-10-CM

## 2016-05-05 MED ORDER — EPINEPHRINE 0.3 MG/0.3ML IJ SOAJ: 0.3000 mg | Freq: Once | INTRAMUSCULAR | Status: DC

## 2016-05-05 NOTE — Progress Notes (Signed)
[place] [facility] [address], [place], [place] [address] [phone]  Date:  05/05/2016   Name:  Carmen Perez   DOB:  04-Oct-1981   MRN:  [id number]  PCP:  Lamar Blinks, MD    Chief Complaint: Establish Care   History of Present Illness:  Carmen Perez is a 35 y.o. very pleasant female patient who presents with the following:  Here today to establish care- she did have a first seizure this spring, found to be due to a cerebral cavernoma. She is now on keppra and is being followed. No further seizures; she will be allowed to start driving in September assuming she contined to do well  About 3 years ago she got hives from eating lamb. She is able to eat poultry and fish ok.  She would like to be tested for a meal allergy if this is possible/  Also would like to fill her epipen prior to an upcoming international trip  BP Readings from Last 3 Encounters:  05/05/16 92/51  03/07/16 116/72  03/02/16 114/66   She is taking keprra without any issues so far.  Her nuurologist is Dr. Delice Lesch  She has an IUD   Patient Active Problem List   Diagnosis Date Noted  . Localization-related symptomatic epilepsy and epileptic syndromes with simple partial seizures, not intractable, without status epilepticus ([place]) 03/09/2016  . Cerebral cavernoma 02/12/2016  . Seizures ([place]) 02/12/2016  . Postpartum care following vaginal delivery (6/26) 05/30/2012    Past Medical History  Diagnosis Date  . HSV (herpes simplex virus) anogenital infection   . Seizures ([place])   . Post partum thyroiditis   . Cavernoma     Past Surgical History  Procedure Laterality Date  . Tonsillectomy  2012  . Knee dislocation surgery Left Age 57    Social History  Substance Use Topics  . Smoking status: Never Smoker   . Smokeless tobacco: None  . Alcohol Use: No    Family History  Problem Relation Age of Onset  . Other Neg Hx   . Hypertension  Mother   . Hypothyroidism Mother   . Hypertension Father   . Transient ischemic attack Father     Allergies  Allergen Reactions  . Meat Extract Hives and Other (See Comments)    Lamb - Red meat    Medication list has been reviewed and updated.  Current Outpatient Prescriptions on File Prior to Visit  Medication Sig Dispense Refill  . ibuprofen (ADVIL,MOTRIN) 200 MG tablet Take 200 mg by mouth every 6 (six) hours as needed for headache.    . levETIRAcetam (KEPPRA) 500 MG tablet Take 1 tablet (500 mg total) by mouth 2 (two) times daily. 180 tablet 3  . Multiple Vitamins-Minerals (HM MULTIVITAMIN ADULT GUMMY PO) Take 1 each by mouth daily.     No current facility-administered medications on file prior to visit.    Review of Systems:  As per HPI- otherwise negative.   Physical Examination: Filed Vitals:   05/05/16 0956  BP: 92/51  Pulse: 77  Temp: 98.3 F (36.8 C)   Filed Vitals:   05/05/16 0956  Height: 5' 7.25" (1.708 m)  Weight: 154 lb 3.2 oz (69.945 kg)   Body mass index is 23.98 kg/(m^2). Ideal Body Weight: Weight in (lb) to have BMI = 25: 160.5  GEN: WDWN, NAD, Non-toxic, A & O x 3, looks well HEENT: Atraumatic, Normocephalic. Neck supple. No masses, No  LAD.  Bilateral TM wnl, oropharynx normal.  PEERL,EOMI.   Ears and Nose: No external deformity. CV: RRR, No M/G/R. No JVD. No thrill. No extra heart sounds. PULM: CTA B, no wheezes, crackles, rhonchi. No retractions. No resp. distress. No accessory muscle use. EXTR: No c/c/e NEURO Normal gait.  PSYCH: Normally interactive. Conversant. Not depressed or anxious appearing.  Calm demeanor.    Assessment and Plan: History of anaphylaxis - Plan: EPINEPHrine 0.3 mg/0.3 mL IJ SOAJ injection  History of seizure  Seizure not long ago, due to a cavernous malformation.  She is stable currently but cannot drive yet Refilled her epipen for he to have on hand.   She is interested in being tested for meat allergy- called  [facility] and confirmed that this is something they can do.  Will let me know and offer a referral  Signed Lamar Blinks, MD

## 2016-05-05 NOTE — Patient Instructions (Signed)
It was nice to meet you today!  I will see what I can find out about testing for meat allergies.

## 2016-05-05 NOTE — Progress Notes (Signed)
Pre visit review using our clinic review tool, if applicable. No additional management support is needed unless otherwise documented below in the visit note. 

## 2016-05-06 ENCOUNTER — Encounter: Payer: Self-pay | Admitting: Neurology

## 2016-05-06 ENCOUNTER — Encounter: Payer: Self-pay | Admitting: Family Medicine

## 2016-05-26 ENCOUNTER — Telehealth: Payer: Self-pay

## 2016-05-26 NOTE — Telephone Encounter (Signed)
Patient given instructions and agreed with plan.  Symptoms are on the right side.

## 2016-05-26 NOTE — Telephone Encounter (Signed)
Back in March patient started having numbness in her left arm and in her back.  She called 911 and while she was in the ambulance, she had a seizure.  Dr. Delice Lesch told her to call if these symptoms ever happened again.  She said that her left arm is going numb right now and she does not know what to do.  Please advise.

## 2016-05-26 NOTE — Telephone Encounter (Signed)
Please confirm which side she is having numbness as her prior note by Dr. Delice Lesch said it was right arm and flank numbness.  She has known left cavernoma which would not cause sensory changes on the left side.  So, if symptoms are on the left side, continue to monitor.  If she is having numbness on the right side, increase Keppra to 750mg  twice daily.

## 2016-09-13 ENCOUNTER — Ambulatory Visit: Payer: BLUE CROSS/BLUE SHIELD | Admitting: Neurology

## 2016-10-21 ENCOUNTER — Ambulatory Visit (INDEPENDENT_AMBULATORY_CARE_PROVIDER_SITE_OTHER): Payer: BLUE CROSS/BLUE SHIELD | Admitting: Neurology

## 2016-10-21 ENCOUNTER — Encounter: Payer: Self-pay | Admitting: Neurology

## 2016-10-21 VITALS — BP 108/60 | HR 87 | Ht 67.25 in | Wt 155.5 lb

## 2016-10-21 DIAGNOSIS — Q283 Other malformations of cerebral vessels: Secondary | ICD-10-CM | POA: Diagnosis not present

## 2016-10-21 DIAGNOSIS — G40109 Localization-related (focal) (partial) symptomatic epilepsy and epileptic syndromes with simple partial seizures, not intractable, without status epilepticus: Secondary | ICD-10-CM

## 2016-10-21 NOTE — Progress Notes (Signed)
NEUROLOGY FOLLOW UP OFFICE NOTE  Carmen Perez PH:[id number]  HISTORY OF PRESENT ILLNESS: I had the pleasure of seeing Carmen Perez in follow-up in the neurology clinic on 10/21/2016.  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 No further body jerking since March 2017. Since her last visit, she called in June to report recurrent right-sided symptoms. Keppra dose increased to 750mg  BID. She started having abrupt onset lightheadedness where she had to sit down, then lost feeling in the last 2 fingers of her right hand. With increase in [place], she has noticed more fatigue at the end of the day. She has had 2 or 3 more episodes of lightheadedness with a little numbness and "not totally tingling" down the last 2 fingers, but not as severe. One time she had tingling on the sole of her right foot. Symptoms lasted less than 30 minutes. She feels more foggy, but is unsure if this is a self-fulfilling prophecy. She has seen Neurosurgery with plans for annual MRIs. She denies any headaches, vision changes, staring/unresponsive episodes, gaps in time, rising epigastric sensation. No neck/back pain, no falls.   HPI 03/07/2016: This is a pleasant 35 yo LH woman with no significant past medical history presenting after new onset seizure last 02/12/2016. She started feeling lightheaded and had a cramping pain in the right flank region. She reports having these burning/crampy pains with lightheadedness occurring around 2 times a year for the past 10 years. They would usually resolve after she would hydrate herself, so she went to get water, but became more lightheaded where she could barely walk. She sat down then lost all feeling on her right arm from the collar bone down to all her fingers. She had difficulty lifting her right arm. She denied any clonic activity at that time. Over the course of 45 minutes, symptoms would come in a cycle with  right flank burning followed by numbness in her arm for a minute, then go away where she could again move her arm normally. She noticed she could not focus on things. Due to worsening symptoms, EMS was called, and in the ambulance she had an episode where she reported her head was forcefully turned to the left side and she could not straighten it. Her right leg was jerking. She could talk and comprehend the entire time. While she was sitting in the ER for 2 hours, she had the recurrent cyclical burning sensation over the right flank followed by right arm numbness lasting 1 minute occurring every 8 minutes. She denied any confusion but on ER notes she reported a sense of detachment and disorientation while in the ambulance. She denies any olfactory/gustatory hallucinations, deja vu, rising epigastric sensation, previous episodes of gaps in time or staring/unresponsiveness. In the ER, CBC and CMP were unremarkable. She had an MRI brain with and without contrast which I personally reviewed, there was a 1.5 x 1.0 cm focus of T1 and T2 hypointensity and susceptibility artifact in the left cingulate gyrus with a small amount of T2 hyperintensity centrally within the lesion. No surrounding edema or mass effect, no abnormal enhancement. Hippocampi symmetric with no abnormal signal or enhancement. Lesion in the cingulate gyrus was favored to represent a cavernoma. No evidence of recent hemorrhage. Her wake and drowsy EEG was normal. She was discharged home on Keppra 500mg  BID and denies any further similar symptoms since 02/12/16.   Epilepsy Risk Factors:  Left cingulate gyrus lesion suggestive of cavernoma. Otherwise she had a normal birth and early development.  There is no history of febrile convulsions, CNS infections such as meningitis/encephalitis, significant traumatic brain injury, neurosurgical procedures, or family history of seizures  PAST MEDICAL HISTORY: Past Medical History:  Diagnosis Date  . Cavernoma     . HSV (herpes simplex virus) anogenital infection   . Post partum thyroiditis   . Seizures ([place])     MEDICATIONS: Current Outpatient Prescriptions on File Prior to Visit  Medication Sig Dispense Refill  . EPINEPHrine 0.3 mg/0.3 mL IJ SOAJ injection Inject 0.3 mLs (0.3 mg total) into the muscle once. Ok to substitue another brand injector 1 Device prn  . ibuprofen (ADVIL,MOTRIN) 200 MG tablet Take 200 mg by mouth every 6 (six) hours as needed for headache.    . levETIRAcetam (KEPPRA) 500 MG tablet Take 1 tablet (500 mg total) by mouth 2 (two) times daily. 180 tablet 3  . Multiple Vitamins-Minerals (HM MULTIVITAMIN ADULT GUMMY PO) Take 1 each by mouth daily.     No current facility-administered medications on file prior to visit.     ALLERGIES: Allergies  Allergen Reactions  . Meat Extract Hives and Other (See Comments)    Lamb - Red meat    FAMILY HISTORY: Family History  Problem Relation Age of Onset  . Other Neg Hx   . Hypertension Mother   . Hypothyroidism Mother   . Hypertension Father   . Transient ischemic attack Father     SOCIAL HISTORY: Social History   Social History  . Marital status: Married    Spouse name: N/A  . Number of children: N/A  . Years of education: N/A   Occupational History  . Not on file.   Social History Main Topics  . Smoking status: Never Smoker  . Smokeless tobacco: Not on file  . Alcohol use No  . Drug use: No  . Sexual activity: Yes   Other Topics Concern  . Not on file   Social History Narrative  . No narrative on file    REVIEW OF SYSTEMS: Constitutional: No fevers, chills, or sweats, no generalized fatigue, change in appetite Eyes: No visual changes, double vision, eye pain Ear, nose and throat: No hearing loss, ear pain, nasal congestion, sore throat Cardiovascular: No chest pain, palpitations Respiratory:  No shortness of breath at rest or with exertion, wheezes GastrointestinaI: No nausea, vomiting, diarrhea,  abdominal pain, fecal incontinence Genitourinary:  No dysuria, urinary retention or frequency Musculoskeletal:  No neck pain, back pain Integumentary: No rash, pruritus, skin lesions Neurological: as above Psychiatric: No depression, insomnia, anxiety Endocrine: No palpitations, fatigue, diaphoresis, mood swings, change in appetite, change in weight, increased thirst Hematologic/Lymphatic:  No anemia, purpura, petechiae. Allergic/Immunologic: no itchy/runny eyes, nasal congestion, recent allergic reactions, rashes  PHYSICAL EXAM: Vitals:   10/21/16 1526  BP: 108/60  Pulse: 87   General: No acute distress Head:  Normocephalic/atraumatic Neck: supple, no paraspinal tenderness, full range of motion Heart:  Regular rate and rhythm Lungs:  Clear to auscultation bilaterally Back: No paraspinal tenderness Skin/Extremities: No rash, no edema Neurological Exam: alert and oriented to person, place, and time. No aphasia or dysarthria. Fund of knowledge is appropriate.  Recent and remote memory are intact.  Attention and concentration are normal.    Able to name objects and repeat phrases. Cranial nerves: Pupils equal, round, reactive to light.  Extraocular movements intact with no nystagmus. Visual fields full. Facial sensation intact. No  facial asymmetry. Tongue, uvula, palate midline.  Motor: Bulk and tone normal, muscle strength 5/5 throughout with no pronator drift.  Sensation to light touch intact.  No extinction to double simultaneous stimulation.  Deep tendon reflexes 2+ throughout, toes downgoing.  Finger to nose testing intact.  Gait narrow-based and steady, able to tandem walk adequately.  Romberg negative.  IMPRESSION: This is a pleasant 35 yo LH woman who presented with recurrent stereotyped episodes where she describes a burning sensation in her right flank, followed by right arm numbness and weakness. She was having multiple episodes on 02/12/16, at one point she had right leg clonic  activity and head deviation to the left. MRI brain showed a left cingulate gyrus lesion suggestive of a cavernoma. Routine EEG normal. Symptoms suggestive of focal seizures arising from left-sided lesion. No further clonic activity since March, however she reports several episodes of lightheadedness followed by numbness and tingling on 2 fingers on her right hand. Keppra dose increased to 750mg  BID with some fatigue. We discussed options, she is agreeable to switching to Lamictal due to side effects on Keppra. She will be travelling out of the country and will call our office once back so we can get started on Lamictal uptitration. She will stay on Keppra 750mg  BID until Lamictal dose is therapeutic. We discussed side effects of Lamictal, including Stevens Johnson syndrome. She will be scheduled for annual MRI brain in March 2018 and follow-up at that time. She is aware of [place] driving laws to stop driving after a seizure, until 6 months seizure-free. She knows to call our office for any changes.   Thank you for allowing me to participate in her care.  Please do not hesitate to call for any questions or concerns.  The duration of this appointment visit was 25 minutes of face-to-face time with the patient.  Greater than 50% of this time was spent in counseling, explanation of diagnosis, planning of further management, and coordination of care.   Ellouise Newer, M.D.   CC: Dr. Lorelei Pont

## 2016-10-21 NOTE — Patient Instructions (Signed)
1. Continue Keppra 750mg  twice a day  2. Schedule MRI brain with and without contrast for March 2018 3. After your trip, call our office and we will get you started on the Lamictal 4. Follow-up after MRI 5. Have a wonderful trip!  Seizure Precautions:. 1. If medication has been prescribed for you to prevent seizures, take it exactly as directed.  Do not stop taking the medicine without talking to your doctor first, even if you have not had a seizure in a long time.   2. Avoid activities in which a seizure would cause danger to yourself or to others.  Don't operate dangerous machinery, swim alone, or climb in high or dangerous places, such as on ladders, roofs, or girders.  Do not drive unless your doctor says you may.  3. If you have any warning that you may have a seizure, lay down in a safe place where you can't hurt yourself.    4.  No driving for 6 months from last seizure, as per [facility].   Please refer to the following link on the [place] website for more information: http://www.epilepsyfoundation.org/answerplace/Social/driving/drivingu.cfm   5.  Maintain good sleep hygiene. Avoid alcohol.  6.  Notify your neurology if you are planning pregnancy or if you become pregnant.  7.  Contact your doctor if you have any problems that may be related to the medicine you are taking.  8.  Call 911 and bring the patient back to the ED if:        A.  The seizure lasts longer than 5 minutes.       B.  The patient doesn't awaken shortly after the seizure  C.  The patient has new problems such as difficulty seeing, speaking or moving  D.  The patient was injured during the seizure  E.  The patient has a temperature over 102 F (39C)  F.  The patient vomited and now is having trouble breathing

## 2016-11-03 ENCOUNTER — Encounter: Payer: Self-pay | Admitting: Neurology

## 2016-11-04 ENCOUNTER — Telehealth: Payer: Self-pay | Admitting: Neurology

## 2016-11-04 NOTE — Telephone Encounter (Signed)
Patient states numbness always on the right side. She has not started any new medications. She states that the lightheadedness is not as strong as when she has the numbness but happening consecutively with the nausea. She also has no appetite.   She will increase Keppra to 1000 mg at night and continue 750 in the morning.   Aware I will send this message to Dr. Delice Lesch and we will call if she wants to do anything different next week.

## 2016-11-04 NOTE — Telephone Encounter (Signed)
Patient called in with new symptoms wondering if this is related to her seizures.   She is currently on Keppra, still having minor episodes of lightheadedness, numbness, and weakness as early as October which she states are how her seizures present.   The last 3-4 days she has had episodes of lightheadedness with minor nausea. No vomiting. The nausea isn't enough to stop her activity, but happening mainly in the afternoon, and did wake her up one night.   She has taken a pregnancy test which is negative. She wants to know if this nausea could represent seizure activity.   Please advise.

## 2016-11-04 NOTE — Telephone Encounter (Signed)
Did she mention which side her numbness is on?  By the notes, she has had lightheadedness before with numbness which was associated with her seizures. Has she started any new medications which could contribute to her nausea?  Sometimes nausea can be associated with seizures.    Looking at the notes, she is traveling out of the country and can certainly increase her nighttime dose of Keppra to 1000mg  and continue 750mg  in the morning, if these spells are getting worse.  Dr. Delice Lesch can also review this when she returns next week.  Haruki Arnold K. Posey Pronto, DO

## 2016-11-08 ENCOUNTER — Telehealth: Payer: Self-pay | Admitting: Neurology

## 2016-11-08 DIAGNOSIS — G40109 Localization-related (focal) (partial) symptomatic epilepsy and epileptic syndromes with simple partial seizures, not intractable, without status epilepticus: Secondary | ICD-10-CM

## 2016-11-08 MED ORDER — LEVETIRACETAM 500 MG PO TABS: ORAL_TABLET | ORAL | 3 refills | Status: DC

## 2016-11-08 NOTE — Telephone Encounter (Signed)
PT left a message in regards to some symptoms she was having over the weekend/Dawn CB# [phone]

## 2016-11-08 NOTE — Telephone Encounter (Signed)
Patient called. She states she increased her Keppra as advised on 11/04/16 to 750mg  in AM and 1000mg  PM . She wanted [place] aware she did have a small seizure 11/04/16 around 3:30pm. Patient states she had another sensory seizure on 11/07/16 that lasted about 45 minutes and then she took her 1000mg  of Keppra and it was gone in about 5 minutes. Patient has some questions she wants addressed to Dr. Delice Lesch. She wants to know why since Thanksgiving she has had 3 seizures? She also asks why last week she had the numbness and naseua that has lead to the last two seizures? Patient also states she goes 11/22/16 out of town for 3 weeks and want to know what is advised she do in and emergency situation or should she reconsider even leaving? Patient also asked a refill be sent on Keppra since she is now taking a larger dose. Pharmacy verified to be sent.

## 2016-11-09 ENCOUNTER — Telehealth: Payer: Self-pay | Admitting: Neurology

## 2016-11-09 MED ORDER — LORAZEPAM 0.5 MG PO TABS: ORAL_TABLET | ORAL | 5 refills | Status: DC

## 2016-11-09 NOTE — Telephone Encounter (Signed)
See other phone note

## 2016-11-09 NOTE — Telephone Encounter (Signed)
Carmen Perez 1981/07/25. She was calling back to follow up on her call from yesterday 11/08/16.  Her # is [id number]. Thank you

## 2016-11-09 NOTE — Telephone Encounter (Signed)
Please advise 

## 2016-11-09 NOTE — Telephone Encounter (Signed)
Carmen Perez, pls make sure Rx for ativan is sent to her pharmacy, thanks

## 2016-11-09 NOTE — Telephone Encounter (Signed)
pls see other phone note

## 2016-11-09 NOTE — Telephone Encounter (Signed)
After her visit, all last week has had low level nausea and lightheadedness since Tues, daily, ebb and flow, lasting couple of hours, no associated numbness. No headache. Called our office Friday and instructed to increase dose to 1750mg  daily, and that day had the full sensory with numbness in the 2 fingers. On Monday, had sz at 7pm, fingers and leg lasting 45 mins, took Keppra and within 39mins felt fine. Wilburn Mylar is the first time she felt fine, back to normal. Has not been sick, no sleep difficulties. Discussed that since keppra was just increased, would not increase for now. Since she reports a 45-minute episode, would recommend she have prn Ativan 0.5mg  to take as needed for seizures lasting more than 15 minutes. Side effects were discussed. She is concerned about brain damage with seizures, patient was reassured regarding simple partial sensory seizures. She is ok to travel to [place].   Rx for Ativan sent to her pharmacy.

## 2016-11-10 NOTE — Telephone Encounter (Signed)
Faxed to pharmacy

## 2016-12-26 ENCOUNTER — Other Ambulatory Visit: Payer: Self-pay

## 2016-12-26 ENCOUNTER — Telehealth: Payer: Self-pay | Admitting: Neurology

## 2016-12-26 MED ORDER — LAMOTRIGINE ER 25 MG PO TB24: ORAL_TABLET | ORAL | 1 refills | Status: DC

## 2016-12-26 NOTE — Telephone Encounter (Signed)
Contacted patient. She wanted to be sure MRI will still be ordered before next office visit. Notified patient I did have a reminder set for self to order MRI beginning of March. Patient also states she is back from her trip and wants to know how she should start process of switching from Keppra to Lamictal.

## 2016-12-26 NOTE — Telephone Encounter (Signed)
Patient notified. Verbalized understanding. RX sent to pharmacy.

## 2016-12-26 NOTE — Telephone Encounter (Signed)
Patient needs to talk to someone about mri and medication please call [phone]

## 2016-12-26 NOTE — Telephone Encounter (Signed)
Pls let her know to start Lamotrigine ER 25mg : Take 1 tablet daily for 2 weeks, then increase to 2 tablet daily for 2 weeks, then increase to 4 tablets daily and continue until I see her. She will stay on the [place] same dose the entire time, when I see her in the office, we will discuss how to reduce Keppra. Pls make sure she has a f/u in 2 months. Pls send Rx for lamotrigine ER with above instructions, #120 with 1 refill. Thanks

## 2017-01-15 ENCOUNTER — Other Ambulatory Visit: Payer: Self-pay | Admitting: Neurology

## 2017-01-15 DIAGNOSIS — G40109 Localization-related (focal) (partial) symptomatic epilepsy and epileptic syndromes with simple partial seizures, not intractable, without status epilepticus: Secondary | ICD-10-CM

## 2017-01-16 NOTE — Telephone Encounter (Signed)
RX refill sent. Per last note patient to continue.

## 2017-01-27 ENCOUNTER — Other Ambulatory Visit: Payer: Self-pay

## 2017-01-27 DIAGNOSIS — G40109 Localization-related (focal) (partial) symptomatic epilepsy and epileptic syndromes with simple partial seizures, not intractable, without status epilepticus: Secondary | ICD-10-CM

## 2017-01-27 DIAGNOSIS — R569 Unspecified convulsions: Secondary | ICD-10-CM

## 2017-02-07 ENCOUNTER — Ambulatory Visit
Admission: RE | Admit: 2017-02-07 | Discharge: 2017-02-07 | Disposition: A | Discharge status: Home / Self Care | Payer: Managed Care, Other (non HMO) | Source: Ambulatory Visit | Attending: Neurology | Admitting: Neurology

## 2017-02-07 DIAGNOSIS — R569 Unspecified convulsions: Secondary | ICD-10-CM

## 2017-02-07 DIAGNOSIS — G40109 Localization-related (focal) (partial) symptomatic epilepsy and epileptic syndromes with simple partial seizures, not intractable, without status epilepticus: Secondary | ICD-10-CM

## 2017-02-07 MED ORDER — GADOBENATE DIMEGLUMINE 529 MG/ML IV SOLN
14.0000 mL | Freq: Once | INTRAVENOUS | Status: AC | PRN
  Administered 2017-02-07: 14 mL via INTRAVENOUS

## 2017-02-08 ENCOUNTER — Encounter: Payer: Self-pay | Admitting: Neurology

## 2017-02-11 ENCOUNTER — Ambulatory Visit (INDEPENDENT_AMBULATORY_CARE_PROVIDER_SITE_OTHER): Payer: Managed Care, Other (non HMO) | Admitting: Family

## 2017-02-11 ENCOUNTER — Encounter: Payer: Self-pay | Admitting: Family

## 2017-02-11 VITALS — BP 120/72 | HR 68 | Temp 98.1°F | Ht 67.25 in | Wt 154.0 lb

## 2017-02-11 DIAGNOSIS — R21 Rash and other nonspecific skin eruption: Secondary | ICD-10-CM | POA: Diagnosis not present

## 2017-02-11 NOTE — Progress Notes (Signed)
Subjective:    Patient ID: Carmen Perez, female    DOB: 1981-02-10, 36 y.o.   MRN: [id number]  CC: Carmen Perez is a 36 y.o. female who presents today for an acute visit.    HPI: CC: rash 4 days on her back. Describes as itchy. Describes then seeing 3 small 'pimples' on left arm. Then thought hives as had tick borne illness to red meat. A day later, the lesions on left forearm resolved. Last night noticed 'tiny dots' on bilateral legs last night. No difficultly breathing, swallowing. No lesions on hands, feet. No fever.  Stopped lamictal yesterday. Started lamictal  12/28/16 and titrated to 50mg  BID for past 2 weeks. Stayed on keppra  Had 2 seizures  when missed lamicatal and had simple focal sensory seizure.   Sees neurology 3/22.         Follows with Dr Delice Lesch neurology- on keppra BID. Switched to lamictal and advised to stay on keppra dose until lamictal dose therapeutic.   HISTORY:  Past Medical History:  Diagnosis Date  . Cavernoma   . HSV (herpes simplex virus) anogenital infection   . Post partum thyroiditis   . Seizures ([place])    Past Surgical History:  Procedure Laterality Date  . KNEE DISLOCATION SURGERY Left Age 41  . TONSILLECTOMY  2012   Family History  Problem Relation Age of Onset  . Hypertension Mother   . Hypothyroidism Mother   . Hypertension Father   . Transient ischemic attack Father   . Other Neg Hx     Allergies: Meat extract Current Outpatient Prescriptions on File Prior to Visit  Medication Sig Dispense Refill  . EPINEPHrine 0.3 mg/0.3 mL IJ SOAJ injection Inject 0.3 mLs (0.3 mg total) into the muscle once. Ok to substitue another brand injector 1 Device prn  . ibuprofen (ADVIL,MOTRIN) 200 MG tablet Take 200 mg by mouth every 6 (six) hours as needed for headache.    . levETIRAcetam (KEPPRA) 500 MG tablet Take 750mg  AM and 1000mg  at night. 315 tablet 3  . levETIRAcetam (KEPPRA) 500 MG tablet TAKE 1 TABLET TWICE A DAY 180 tablet 3  . LORazepam (ATIVAN) 0.5 MG tablet Take 1 tablet as needed for seizures lasting more than 15 minutes. May take second dose if needed, do not take more than 2 a day. 10 tablet 5  . Multiple Vitamins-Minerals (HM MULTIVITAMIN ADULT GUMMY PO) Take 1 each by mouth daily.    . LamoTRIgine (LAMICTAL XR) 25 MG TB24 tablet Take 1 tablet daily for 2 weeks, then increase to 2 tablet daily for 2 weeks, then increase to 4 tablets daily and continue (Patient not taking: Reported on 02/11/2017) 120 tablet 1   No current facility-administered medications on file prior to visit.     Social History  Substance Use Topics  . Smoking status: Never Smoker  . Smokeless tobacco: Not on  file  . Alcohol use No    Review of Systems  Constitutional: Negative for chills and fever.  HENT: Negative for sore throat and trouble swallowing.   Respiratory: Negative for cough and shortness of breath.   Cardiovascular: Negative for chest pain and palpitations.  Gastrointestinal: Negative for nausea and vomiting.  Skin: Positive for rash (resolved).      Objective:    BP 120/72 (BP Location: Right Arm, Patient Position: Sitting, Cuff Size: Normal)   Pulse 68   Temp 98.1 F (36.7 C) (Oral)   Ht 5' 7.25" (1.708 m)   Wt 154 lb (69.9 kg)   SpO2 98%   BMI 23.94 kg/m    Physical Exam  Constitutional: She appears well-developed and well-nourished.  HENT:  Mouth/Throat: No oral lesions. No posterior oropharyngeal edema or posterior oropharyngeal erythema.  Eyes: Conjunctivae are normal.  Cardiovascular: Normal rate, regular rhythm, normal heart sounds and normal pulses.   Pulmonary/Chest: Effort normal and breath sounds normal. She has no wheezes. She has no rhonchi. She has no rales.  Neurological: She is alert.  Skin: Skin is warm and dry. No rash noted.  No evidence of any rash.   Psychiatric: She has  a normal mood and affect. Her speech is normal and behavior is normal. Thought content normal.  Vitals reviewed.      Assessment & Plan:  1. Rash No fever, rash. No lesions in oropharynx, hands, feet. Patient is off of  Lamictal x one day.  no seizures. Patient and I had long discussion that no evidence of SJS at this time. We jointly decided in absence of rash, we would not start prednisone. Advised very close vigilance and patient will contact neurology in 2 days.     I am having Ms. Howlett maintain her Multiple Vitamins-Minerals (HM MULTIVITAMIN ADULT GUMMY PO), ibuprofen, EPINEPHrine, levETIRAcetam, LORazepam, LamoTRIgine, and levETIRAcetam.   No orders of the defined types were placed in this encounter.   Return precautions given.   Risks, benefits, and  alternatives of the medications and treatment plan prescribed today were discussed, and patient expressed understanding.   Education regarding symptom management and diagnosis given to patient on AVS.  Continue to follow with COPLAND,JESSICA, MD for routine health maintenance.   [name] and I agreed with plan.   Mable Paris, FNP

## 2017-02-11 NOTE — Progress Notes (Signed)
Pre visit review using our clinic review tool, if applicable. No additional management support is needed unless otherwise documented below in the visit note. 

## 2017-02-11 NOTE — Patient Instructions (Signed)
Please stay very vigilant.   Let me know if any new lesions present.

## 2017-02-13 ENCOUNTER — Telehealth: Payer: Self-pay | Admitting: Neurology

## 2017-02-13 ENCOUNTER — Encounter: Payer: Self-pay | Admitting: Neurology

## 2017-02-13 NOTE — Telephone Encounter (Signed)
Pt clld over the w/e - CC: Rash - Upper/lower extremities ( legs,thighs, arms) on/off x 2 dys. Small tiny dots - itchy; doesn't look like hives or welts.   Pt states she started Lamictal x 1 mth.  Pls advise.

## 2017-02-13 NOTE — Telephone Encounter (Signed)
PT called and said she made several phone calls over the weekend to on call nurse and wanted to update Dr Briant Sites  CB#[phone]

## 2017-02-17 NOTE — Telephone Encounter (Signed)
Replied on MyChart, she has a f/u next week.

## 2017-02-23 ENCOUNTER — Ambulatory Visit: Payer: BLUE CROSS/BLUE SHIELD | Admitting: Neurology

## 2017-02-26 ENCOUNTER — Other Ambulatory Visit: Payer: Self-pay | Admitting: Neurology

## 2017-02-28 ENCOUNTER — Ambulatory Visit (INDEPENDENT_AMBULATORY_CARE_PROVIDER_SITE_OTHER): Payer: Managed Care, Other (non HMO) | Admitting: Neurology

## 2017-02-28 ENCOUNTER — Encounter: Payer: Self-pay | Admitting: Neurology

## 2017-02-28 VITALS — BP 108/62 | HR 78 | Temp 98.7°F | Resp 16 | Ht 67.5 in | Wt 155.0 lb

## 2017-02-28 DIAGNOSIS — G40109 Localization-related (focal) (partial) symptomatic epilepsy and epileptic syndromes with simple partial seizures, not intractable, without status epilepticus: Secondary | ICD-10-CM

## 2017-02-28 DIAGNOSIS — Q283 Other malformations of cerebral vessels: Secondary | ICD-10-CM | POA: Diagnosis not present

## 2017-02-28 MED ORDER — LEVETIRACETAM ER 500 MG PO TB24: ORAL_TABLET | ORAL | 6 refills | Status: DC

## 2017-02-28 MED ORDER — LEVETIRACETAM ER 750 MG PO TB24: ORAL_TABLET | ORAL | 6 refills | Status: DC

## 2017-02-28 NOTE — Patient Instructions (Signed)
1. Switch to Keppra XR 1750mg : Take 2 of the 500mg  tablets and 1 of the 750mg  tablet 2. Follow-up in 4 months, call for any changes  Seizure Precautions: 1. If medication has been prescribed for you to prevent seizures, take it exactly as directed.  Do not stop taking the medicine without talking to your doctor first, even if you have not had a seizure in a long time.   2. Avoid activities in which a seizure would cause danger to yourself or to others.  Don't operate dangerous machinery, swim alone, or climb in high or dangerous places, such as on ladders, roofs, or girders.  Do not drive unless your doctor says you may.  3. If you have any warning that you may have a seizure, lay down in a safe place where you can't hurt yourself.    4.  No driving for 6 months from last seizure, as per [facility].   Please refer to the following link on the [place] website for more information: http://www.epilepsyfoundation.org/answerplace/Social/driving/drivingu.cfm   5.  Maintain good sleep hygiene. Avoid alcohol.  6.  Notify your neurology if you are planning pregnancy or if you become pregnant.  7.  Contact your doctor if you have any problems that may be related to the medicine you are taking.  8.  Call 911 and bring the patient back to the ED if:        A.  The seizure lasts longer than 5 minutes.       B.  The patient doesn't awaken shortly after the seizure  C.  The patient has new problems such as difficulty seeing, speaking or moving  D.  The patient was injured during the seizure  E.  The patient has a temperature over 102 F (39C)  F.  The patient vomited and now is having trouble breathing

## 2017-02-28 NOTE — Progress Notes (Signed)
NEUROLOGY FOLLOW UP OFFICE NOTE  Carmen Perez [id number]  HISTORY OF PRESENT ILLNESS: I had the pleasure of seeing Carmen Perez in follow-up in the neurology clinic on 02/28/2017. 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 No further body jerking since March 2017. Since her last visit, she called in June to report recurrent right-sided symptoms. Keppra dose increased to 750mg  BID. She started having abrupt onset lightheadedness where she had to sit down, then lost feeling in the last 2 fingers of her right hand. With increase in [place], she has noticed more fatigue at the end of the day. She continued to report episodes of lightheadedness and sensory symptoms on the right hand, some lasting 45 minutes. She was given a prescription for prn Ativan, and was also started on Lamotrigine. She went on a trip to [place], and noticed that with less stress, she had no symptoms during the trip except towards the end after a whole day of traveling by car. She had nubmness and took her evening Keppra dose. An hour later, she had a "wave" sensation over her head and body, she could not get up from bed for 10-15 minutes. She had a tiny bit of numbness in [place] after several days of traveling, and since then has had perhaps 1-3 very small incidents of light numbness. She missed 2 doses of Keppra and a few days later on 12/26/16 had numnbes sin her fingers and foot for 10-20 minutes. She started the Lamotrigine in February, but started having weird back/"kidney" pain. She reports a bigger event on 01/20/17 where she had a moving leg and arm sensation, she felt she could not move for 15 minutes. She had a suspected rash a few weeks later, with itching in her lower back but no visible rash. Later on she noticed a few small bumps in her elbow and arm. She ultimately stopped the medication and when she saw her PCP, there were no further bums.  Overall she feels that in the past year, she has had a total of 13 seizures. She did not like the side effects on Lamotrigine, she had problems spelling and remembering. She does feel tired on the Keppra, but would prefer the tiredness over the inability to finish sentences or spell words.   I personally reviewed repeat MRI brain with and without contrast done 02/07/17 which showed stable left posterior cingulate gyrus 1.5cm lesion with signal characteristics most consistent with cavernoma.  HPI 03/07/2016: This is a pleasant 36 yo LH woman with no significant past medical history presenting after new onset seizure last 02/12/2016. She started feeling lightheaded and had a cramping pain in the right flank region. She reports having these burning/crampy pains with lightheadedness occurring around 2 times a year for the past 10 years. They would usually resolve after she would hydrate herself, so she went to get water, but became more lightheaded where she could barely walk. She sat down then lost all feeling on her right arm from the collar bone down to all her fingers. She had difficulty lifting her right arm. She denied any clonic activity at that time. Over the course of 45 minutes, symptoms would come in a cycle with right flank burning followed by numbness in her arm for a minute, then go away where she could again move her arm normally. She noticed she could not focus on things. Due to worsening symptoms, EMS  was called, and in the ambulance she had an episode where she reported her head was forcefully turned to the left side and she could not straighten it. Her right leg was jerking. She could talk and comprehend the entire time. While she was sitting in the ER for 2 hours, she had the recurrent cyclical burning sensation over the right flank followed by right arm numbness lasting 1 minute occurring every 8 minutes. She denied any confusion but on ER notes she reported a sense of detachment and disorientation  while in the ambulance. She denies any olfactory/gustatory hallucinations, deja vu, rising epigastric sensation, previous episodes of gaps in time or staring/unresponsiveness. In the ER, CBC and CMP were unremarkable. She had an MRI brain with and without contrast which I personally reviewed, there was a 1.5 x 1.0 cm focus of T1 and T2 hypointensity and susceptibility artifact in the left cingulate gyrus with a small amount of T2 hyperintensity centrally within the lesion. No surrounding edema or mass effect, no abnormal enhancement. Hippocampi symmetric with no abnormal signal or enhancement. Lesion in the cingulate gyrus was favored to represent a cavernoma. No evidence of recent hemorrhage. Her wake and drowsy EEG was normal. She was discharged home on Keppra 500mg  BID and denies any further similar symptoms since 02/12/16.   Epilepsy Risk Factors:  Left cingulate gyrus lesion suggestive of cavernoma. Otherwise she had a normal birth and early development.  There is no history of febrile convulsions, CNS infections such as meningitis/encephalitis, significant traumatic brain injury, neurosurgical procedures, or family history of seizures  PAST MEDICAL HISTORY: Past Medical History:  Diagnosis Date  . Cavernoma   . HSV (herpes simplex virus) anogenital infection   . Post partum thyroiditis   . Seizures ([name])     MEDICATIONS: Current Outpatient Prescriptions on File Prior to Visit  Medication Sig Dispense Refill  . EPINEPHrine 0.3 mg/0.3 mL IJ SOAJ injection Inject 0.3 mLs (0.3 mg total) into the muscle once. Ok to substitue another brand injector 1 Device prn  . ibuprofen (ADVIL,MOTRIN) 200 MG tablet Take 200 mg by mouth every 6 (six) hours as needed for headache.    . LamoTRIgine (LAMICTAL XR) 25 MG TB24 tablet TAKE 1 TABLET DAILY FOR 2 WKS, THEN 2 TABLETS DAILY FOR 2 WKS, THEN INCREASE TO 4 TABS DAILY. 120 tablet 1  . levETIRAcetam (KEPPRA) 500 MG tablet Take 750mg  AM and 1000mg  at night. 315  tablet 3  . levETIRAcetam (KEPPRA) 500 MG tablet TAKE 1 TABLET TWICE A DAY 180 tablet 3  . LORazepam (ATIVAN) 0.5 MG tablet Take 1 tablet as needed for seizures lasting more than 15 minutes. May take second dose if needed, do not take more than 2 a day. 10 tablet 5  . Multiple Vitamins-Minerals (HM MULTIVITAMIN ADULT GUMMY PO) Take 1 each by mouth daily.     No current facility-administered medications on file prior to visit.     ALLERGIES: Allergies  Allergen Reactions  . Meat Extract Hives and Other (See Comments)    Lamb - Red meat    FAMILY HISTORY: Family History  Problem Relation Age of Onset  . Hypertension Mother   . Hypothyroidism Mother   . Hypertension Father   . Transient ischemic attack Father   . Other Neg Hx     SOCIAL HISTORY: Social History   Social History  . Marital status: Married    Spouse name: N/A  . Number of children: N/A  . Years of education: N/A  Occupational History  . Not on file.   Social History Main Topics  . Smoking status: Never Smoker  . Smokeless tobacco: Not on file  . Alcohol use No  . Drug use: No  . Sexual activity: Yes   Other Topics Concern  . Not on file   Social History Narrative  . No narrative on file    REVIEW OF SYSTEMS: Constitutional: No fevers, chills, or sweats, no generalized fatigue, change in appetite Eyes: No visual changes, double vision, eye pain Ear, nose and throat: No hearing loss, ear pain, nasal congestion, sore throat Cardiovascular: No chest pain, palpitations Respiratory:  No shortness of breath at rest or with exertion, wheezes GastrointestinaI: No nausea, vomiting, diarrhea, abdominal pain, fecal incontinence Genitourinary:  No dysuria, urinary retention or frequency Musculoskeletal:  No neck pain, back pain Integumentary: No rash, pruritus, skin lesions Neurological: as above Psychiatric: No depression, insomnia, anxiety Endocrine: No palpitations, fatigue, diaphoresis, mood swings,  change in appetite, change in weight, increased thirst Hematologic/Lymphatic:  No anemia, purpura, petechiae. Allergic/Immunologic: no itchy/runny eyes, nasal congestion, recent allergic reactions, rashes  PHYSICAL EXAM: Vitals:   02/28/17 1513  BP: 108/62  Pulse: 78  Resp: 16  Temp: 98.7 F (37.1 C)   General: No acute distress Head:  Normocephalic/atraumatic Neck: supple, no paraspinal tenderness, full range of motion Heart:  Regular rate and rhythm Lungs:  Clear to auscultation bilaterally Back: No paraspinal tenderness Skin/Extremities: No rash, no edema Neurological Exam: alert and oriented to person, place, and time. No aphasia or dysarthria. Fund of knowledge is appropriate.  Recent and remote memory are intact.  Attention and concentration are normal.    Able to name objects and repeat phrases. Cranial nerves: Pupils equal, round, reactive to light.  Extraocular movements intact with no nystagmus. Visual fields full. Facial sensation intact. No facial asymmetry. Tongue, uvula, palate midline.  Motor: Bulk and tone normal, muscle strength 5/5 throughout with no pronator drift.  Sensation to light touch intact.  No extinction to double simultaneous stimulation.  Deep tendon reflexes 2+ throughout, toes downgoing.  Finger to nose testing intact.  Gait narrow-based and steady, able to tandem walk adequately.  Romberg negative.  IMPRESSION: This is a pleasant 36 yo LH woman who presented with recurrent stereotyped episodes where she describes a burning sensation in her right flank, followed by right arm numbness and weakness. She was having multiple episodes on 02/12/16, at one point she had right leg clonic activity and head deviation to the left. MRI brain showed a left cingulate gyrus lesion suggestive of a cavernoma. Routine EEG normal. Symptoms suggestive of focal seizures arising from left-sided lesion. No further clonic activity since March 2017, she reports occasional episodes of  lightheadedness followed by numbness and tingling on 2 fingers on her right hand She is taking Keppra 750mg  in AM, 1000mg  in PM. She continues to have fatigue on this dose, but does not want to try a different AED after side effects from Lamotrigine. We will try switching to Keppra XR 1750mg  qhs and monitor if this helps with daytime fatigue. We discussed her stable MRI, and discussed different medication options, potentially addition of Vimpat in the future, if needed. She is aware of  driving laws to stop driving after a seizure, until 6 months seizure-free. She will follow-up in 4 months and knows to call our office for any changes.   Thank you for allowing me to participate in her care.  Please do not hesitate to call for any  questions or concerns.  The duration of this appointment visit was 25 minutes of face-to-face time with the patient.  Greater than 50% of this time was spent in counseling, explanation of diagnosis, planning of further management, and coordination of care.   Ellouise Newer, M.D.   CC: Dr. Lorelei Pont

## 2017-03-06 ENCOUNTER — Encounter: Payer: Self-pay | Admitting: Neurology

## 2017-07-17 ENCOUNTER — Ambulatory Visit (INDEPENDENT_AMBULATORY_CARE_PROVIDER_SITE_OTHER): Payer: Managed Care, Other (non HMO) | Admitting: Neurology

## 2017-07-17 ENCOUNTER — Encounter: Payer: Self-pay | Admitting: Neurology

## 2017-07-17 VITALS — BP 112/66 | HR 76 | Ht 67.0 in | Wt 161.0 lb

## 2017-07-17 DIAGNOSIS — G40109 Localization-related (focal) (partial) symptomatic epilepsy and epileptic syndromes with simple partial seizures, not intractable, without status epilepticus: Secondary | ICD-10-CM

## 2017-07-17 DIAGNOSIS — Q283 Other malformations of cerebral vessels: Secondary | ICD-10-CM

## 2017-07-17 MED ORDER — LEVETIRACETAM ER 750 MG PO TB24: ORAL_TABLET | ORAL | 11 refills | Status: DC

## 2017-07-17 MED ORDER — LEVETIRACETAM ER 500 MG PO TB24: ORAL_TABLET | ORAL | 11 refills | Status: DC

## 2017-07-17 NOTE — Patient Instructions (Signed)
1. Continue Keppra XR 1750mg  daily 2. Continue seizure calendar 3. Follow-up in 6 months, call for any changes  Seizure Precautions: 1. If medication has been prescribed for you to prevent seizures, take it exactly as directed.  Do not stop taking the medicine without talking to your doctor first, even if you have not had a seizure in a long time.   2. Avoid activities in which a seizure would cause danger to yourself or to others.  Don't operate dangerous machinery, swim alone, or climb in high or dangerous places, such as on ladders, roofs, or girders.  Do not drive unless your doctor says you may.  3. If you have any warning that you may have a seizure, lay down in a safe place where you can't hurt yourself.    4.  No driving for 6 months from last seizure, as per [facility].   Please refer to the following link on the [facility] website for more information: [email or web address]/[email or web address]   5.  Maintain good sleep hygiene. Avoid alcohol.  6.  Notify your neurology if you are planning pregnancy or if you become pregnant.  7.  Contact your doctor if you have any problems that may be related to the medicine you are taking.  8.  Call 911 and bring the patient back to the ED if:        A.  The seizure lasts longer than 5 minutes.       B.  The patient doesn't awaken shortly after the seizure  C.  The patient has new problems such as difficulty seeing, speaking or moving  D.  The patient was injured during the seizure  E.  The patient has a temperature over 102 F (39C)  F.  The patient vomited and now is having trouble breathing

## 2017-07-17 NOTE — Progress Notes (Signed)
NEUROLOGY FOLLOW UP OFFICE NOTE  Carmen Perez [id number]  HISTORY OF PRESENT ILLNESS: I had the pleasure of seeing Carmen Perez in follow-up in the neurology clinic on 07/17/2017. 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 No further body jerking since March 2017. She was reporting lightheadedness and right-sided sensory symptoms. She had side effects on Lamotrigine. She was switched to extended release Keppra on her last visit due to report of fatigue. She still feels tired, but feels that the extended-release formulation is controlling her seizures better. She brings a calendar of symptoms and reports around 1 seizure a month. She reports having one on 4/27, 5/26, 6/14, 7/20, and 8/11. Most of them would be a wave of sensation, then numbness in her right hand, lasting only a few minutes. She had 2 (June and July) where they were again preceded by "kidney" pain, with sharp pain in her flank, followed by the wave sensation. The seizure on 8/11 occurred at 130am with sleep deprivation taking care of her mother. She has some nausea before. She is unsure if they are related to her menstrual period, but her period comes at the end of the month. She has seen Dr. Kathyrn Sheriff recently, next MRI brain without contrast in March 2019.   HPI 03/07/2016: This is a pleasant 36 yo LH woman with no significant past medical history presenting after new onset seizure last 02/12/2016. She started feeling lightheaded and had a cramping pain in the right flank region. She reports having these burning/crampy pains with lightheadedness occurring around 2 times a year for the past 10 years. They would usually resolve after she would hydrate herself, so she went to get water, but became more lightheaded where she could barely walk. She sat down then lost all feeling on her right arm from the collar bone down to all her fingers. She had  difficulty lifting her right arm. She denied any clonic activity at that time. Over the course of 45 minutes, symptoms would come in a cycle with right flank burning followed by numbness in her arm for a minute, then go away where she could again move her arm normally. She noticed she could not focus on things. Due to worsening symptoms, EMS was called, and in the ambulance she had an episode where she reported her head was forcefully turned to the left side and she could not straighten it. Her right leg was jerking. She could talk and comprehend the entire time. While she was sitting in the ER for 2 hours, she had the recurrent cyclical burning sensation over the right flank followed by right arm numbness lasting 1 minute occurring every 8 minutes. She denied any confusion but on ER notes she reported a sense of detachment and disorientation while in the ambulance. She denies any olfactory/gustatory hallucinations, deja vu, rising epigastric sensation, previous episodes of gaps in time or staring/unresponsiveness. In the ER, CBC and CMP were unremarkable. She had an MRI brain with and without contrast which I personally reviewed, there was a 1.5 x 1.0 cm focus of T1 and T2 hypointensity and susceptibility artifact in the left cingulate gyrus with a small amount of T2 hyperintensity centrally within the lesion. No surrounding edema or mass effect, no abnormal enhancement. Hippocampi symmetric with no abnormal signal or enhancement. Lesion in the cingulate gyrus was favored to represent a cavernoma. No evidence of recent hemorrhage.  Her wake and drowsy EEG was normal. She was discharged home on Keppra 500mg  BID and denies any further similar symptoms since 02/12/16.   Epilepsy Risk Factors:  Left cingulate gyrus lesion suggestive of cavernoma. Otherwise she had a normal birth and early development.  There is no history of febrile convulsions, CNS infections such as meningitis/encephalitis, significant traumatic  brain injury, neurosurgical procedures, or family history of seizures  I personally reviewed repeat MRI brain with and without contrast done 02/07/17 which showed stable left posterior cingulate gyrus 1.5cm lesion with signal characteristics most consistent with cavernoma.   PAST MEDICAL HISTORY: Past Medical History:  Diagnosis Date  . Cavernoma   . HSV (herpes simplex virus) anogenital infection   . Post partum thyroiditis   . Seizures ([place])     MEDICATIONS: Current Outpatient Prescriptions on File Prior to Visit  Medication Sig Dispense Refill  . EPINEPHrine 0.3 mg/0.3 mL IJ SOAJ injection Inject 0.3 mLs (0.3 mg total) into the muscle once. Ok to substitue another brand injector 1 Device prn  . ibuprofen (ADVIL,MOTRIN) 200 MG tablet Take 200 mg by mouth every 6 (six) hours as needed for headache.    Marland Kitchen LORazepam (ATIVAN) 0.5 MG tablet Take 1 tablet as needed for seizures lasting more than 15 minutes. May take second dose if needed, do not take more than 2 a day. 10 tablet 5  . Multiple Vitamins-Minerals (HM MULTIVITAMIN ADULT GUMMY PO) Take 1 each by mouth daily.    Marland Kitchen levETIRAcetam (KEPPRA XR) 500 MG 24 hr tablet Take 2 tablets at night (take in addition with Levetiracetam ER 750mg  tablet for total of 1750mg  daily) (Patient not taking: Reported on 07/17/2017) 60 tablet 6  . Levetiracetam 750 MG TB24 Take 1 tablet at night (take with 2 tablets of Levetiracetam ER 500mg , for total of 1750mg  daily) (Patient not taking: Reported on 07/17/2017) 30 tablet 6   No current facility-administered medications on file prior to visit.     ALLERGIES: Allergies  Allergen Reactions  . Lamictal [Lamotrigine] Rash  . Meat Extract Hives and Other (See Comments)    Lamb - Red meat    FAMILY HISTORY: Family History  Problem Relation Age of Onset  . Hypertension Mother   . Hypothyroidism Mother   . Hypertension Father   . Transient ischemic attack Father   . Other Neg Hx     SOCIAL  HISTORY: Social History   Social History  . Marital status: Married    Spouse name: N/A  . Number of children: N/A  . Years of education: N/A   Occupational History  . Not on file.   Social History Main Topics  . Smoking status: Never Smoker  . Smokeless tobacco: Never Used  . Alcohol use No  . Drug use: No  . Sexual activity: Yes    Partners: Male    Birth control/ protection: IUD   Other Topics Concern  . Not on file   Social History Narrative  . No narrative on file    REVIEW OF SYSTEMS: Constitutional: No fevers, chills, or sweats, no generalized fatigue, change in appetite Eyes: No visual changes, double vision, eye pain Ear, nose and throat: No hearing loss, ear pain, nasal congestion, sore throat Cardiovascular: No chest pain, palpitations Respiratory:  No shortness of breath at rest or with exertion, wheezes GastrointestinaI: No nausea, vomiting, diarrhea, abdominal pain, fecal incontinence Genitourinary:  No dysuria, urinary retention or frequency Musculoskeletal:  No neck pain, back pain Integumentary: No rash, pruritus,  skin lesions Neurological: as above Psychiatric: No depression, insomnia, anxiety Endocrine: No palpitations, fatigue, diaphoresis, mood swings, change in appetite, change in weight, increased thirst Hematologic/Lymphatic:  No anemia, purpura, petechiae. Allergic/Immunologic: no itchy/runny eyes, nasal congestion, recent allergic reactions, rashes  PHYSICAL EXAM: Vitals:   07/17/17 1454  BP: 112/66  Pulse: 76  SpO2: 98%   General: No acute distress Head:  Normocephalic/atraumatic Neck: supple, no paraspinal tenderness, full range of motion Heart:  Regular rate and rhythm Lungs:  Clear to auscultation bilaterally Back: No paraspinal tenderness Skin/Extremities: No rash, no edema Neurological Exam: alert and oriented to person, place, and time. No aphasia or dysarthria. Fund of knowledge is appropriate.  Recent and remote memory are  intact.  Attention and concentration are normal.    Able to name objects and repeat phrases. Cranial nerves: Pupils equal, round, reactive to light.  Extraocular movements intact with no nystagmus. Visual fields full. Facial sensation intact. No facial asymmetry. Tongue, uvula, palate midline.  Motor: Bulk and tone normal, muscle strength 5/5 throughout with no pronator drift.  Sensation to light touch intact.  No extinction to double simultaneous stimulation.  Deep tendon reflexes 2+ throughout, toes downgoing.  Finger to nose testing intact.  Gait narrow-based and steady, able to tandem walk adequately.  Romberg negative.  IMPRESSION: This is a pleasant 36 yo LH woman who presented with recurrent stereotyped episodes where she describes a burning sensation in her right flank, followed by right arm numbness and weakness. She was having multiple episodes on 02/12/16, at one point she had right leg clonic activity and head deviation to the left. MRI brain showed a left cingulate gyrus lesion suggestive of a cavernoma. Routine EEG normal. Symptoms suggestive of focal seizures arising from left-sided lesion. No further clonic activity since March 2017, she reports occasional episodes of lightheadedness followed by numbness and tingling on 2 fingers on her right hand occurring once a month. She is satisfied with seizure control and does not want to increase dose of Keppra XR 1750mg  daily. She is aware of [place] driving laws to stop driving after a seizure, until 6 months seizure-free. She will follow-up in 6 months and knows to call our office for any changes.   Thank you for allowing me to participate in her care.  Please do not hesitate to call for any questions or concerns.  The duration of this appointment visit was 25 minutes of face-to-face time with the patient.  Greater than 50% of this time was spent in counseling, explanation of diagnosis, planning of further management, and coordination of care.   Ellouise Newer, M.D.   CC: Dr. Lorelei Pont, Dr. Kathyrn Sheriff

## 2017-07-24 ENCOUNTER — Telehealth: Payer: Self-pay

## 2017-07-24 NOTE — Telephone Encounter (Signed)
Spoke with pt's husband pt had an episode lasting almost an hour by the time call was placed. They were skeptical about taking ativan and wanted to take her Keppra dose early.  I advised them to take keppra at normal time (before bed) and to take ativan now, as it was prescribed for these types of events.  They verbalized understanding.

## 2017-07-31 ENCOUNTER — Telehealth: Payer: Self-pay

## 2017-07-31 NOTE — Telephone Encounter (Signed)
Called to follow up with pt.  No answer, LMOM asking pt to return my call.

## 2017-12-08 ENCOUNTER — Telehealth: Payer: Self-pay | Admitting: Neurology

## 2017-12-08 NOTE — Telephone Encounter (Signed)
Patient is been having symptoms since 12-02-18 very mild please call to let her know what she needs to do

## 2017-12-09 ENCOUNTER — Other Ambulatory Visit: Payer: Self-pay | Admitting: Neurology

## 2017-12-11 ENCOUNTER — Other Ambulatory Visit: Payer: Self-pay | Admitting: Neurology

## 2017-12-11 NOTE — Telephone Encounter (Signed)
Pt called and said the pharmacy has not received the request for her prescription refill yet

## 2017-12-11 NOTE — Telephone Encounter (Signed)
Patient lmom regarding needing a refill on a Rescue Medication. She said a request was being sent over as well. Please Call. Thanks

## 2017-12-12 ENCOUNTER — Telehealth: Payer: Self-pay | Admitting: Neurology

## 2017-12-12 ENCOUNTER — Other Ambulatory Visit: Payer: Self-pay | Admitting: Neurology

## 2017-12-12 NOTE — Telephone Encounter (Signed)
Spoke with Carmen Perez.  She states that over the past week or so she believes that she has experienced 4 or 5 partial seizures.  Tingling, left sided numbness, nausea lasting all day.  She googled her symptoms and saw that these symptoms are also side effects to Keppra.  She is now wondering if these are seizures or side effects that she is experiencing.  She states that usually her numbness, tingling and nausea are very mild, however now seem to be more severe.  Carmen Perez has also taken about 5 pregnancy tests = all negative.  Please advise.

## 2017-12-12 NOTE — Telephone Encounter (Signed)
Patient wants to talk to someone about what is going on with her

## 2017-12-12 NOTE — Telephone Encounter (Signed)
Rx was faxed at 3:46 on 12/11/17 with confirmation of pharmacy receiving.

## 2017-12-12 NOTE — Telephone Encounter (Signed)
Pls let her know that unfortunately if you google anything, something will come up affirming things. Keppra does not cause focal symptoms on just one side. Would suggest we check a keppra level to see how much medication is in her system. Thanks

## 2017-12-13 NOTE — Telephone Encounter (Signed)
LMOM relaying message below.  

## 2017-12-14 ENCOUNTER — Other Ambulatory Visit: Payer: Self-pay

## 2017-12-14 ENCOUNTER — Other Ambulatory Visit: Payer: Managed Care, Other (non HMO)

## 2017-12-14 DIAGNOSIS — G40109 Localization-related (focal) (partial) symptomatic epilepsy and epileptic syndromes with simple partial seizures, not intractable, without status epilepticus: Secondary | ICD-10-CM

## 2017-12-18 LAB — LEVETIRACETAM LEVEL: Keppra (Levetiracetam): 21 ug/mL

## 2017-12-19 ENCOUNTER — Ambulatory Visit: Payer: Managed Care, Other (non HMO) | Admitting: Neurology

## 2017-12-27 ENCOUNTER — Telehealth: Payer: Self-pay

## 2017-12-27 NOTE — Telephone Encounter (Signed)
-----   Message from Cameron Sprang, MD sent at 12/25/2017  8:45 AM EST ----- Pls let her know the Keppra level is mid-range for the dose she is taking, it is not too high or too low. Would continue current dose for now and follow-up as scheduled next month to discuss symptoms and medication. Thanks

## 2017-12-27 NOTE — Telephone Encounter (Signed)
LMOM relaying message below.  

## 2018-01-22 ENCOUNTER — Ambulatory Visit (INDEPENDENT_AMBULATORY_CARE_PROVIDER_SITE_OTHER): Payer: Managed Care, Other (non HMO) | Admitting: Neurology

## 2018-01-22 ENCOUNTER — Encounter: Payer: Self-pay | Admitting: Neurology

## 2018-01-22 ENCOUNTER — Other Ambulatory Visit: Payer: Self-pay

## 2018-01-22 VITALS — BP 100/58 | HR 78 | Ht 67.0 in | Wt 154.0 lb

## 2018-01-22 DIAGNOSIS — Q283 Other malformations of cerebral vessels: Secondary | ICD-10-CM

## 2018-01-22 DIAGNOSIS — G40109 Localization-related (focal) (partial) symptomatic epilepsy and epileptic syndromes with simple partial seizures, not intractable, without status epilepticus: Secondary | ICD-10-CM

## 2018-01-22 NOTE — Patient Instructions (Addendum)
Great seeing you! Continue current medications and symptom calendar. Follow-up in 6 months, call for any changes.   Seizure Precautions: 1. If medication has been prescribed for you to prevent seizures, take it exactly as directed.  Do not stop taking the medicine without talking to your doctor first, even if you have not had a seizure in a long time.   2. Avoid activities in which a seizure would cause danger to yourself or to others.  Don't operate dangerous machinery, swim alone, or climb in high or dangerous places, such as on ladders, roofs, or girders.  Do not drive unless your doctor says you may.  3. If you have any warning that you may have a seizure, lay down in a safe place where you can't hurt yourself.    4.  No driving for 6 months from last seizure, as per [facility].   Please refer to the following link on the [employer] website for more information: http://www.epilepsyfoundation.org/answerplace/Social/driving/drivingu.cfm   5.  Maintain good sleep hygiene. Avoid alcohol.  6.  Notify your neurology if you are planning pregnancy or if you become pregnant.  7.  Contact your doctor if you have any problems that may be related to the medicine you are taking.  8.  Call 911 and bring the patient back to the ED if:                   A.  The seizure lasts longer than 5 minutes.                  B.  The patient doesn't awaken shortly after the seizure             C.  The patient has new problems such as difficulty seeing, speaking or moving             D.  The patient was injured during the seizure             E.  The patient has a temperature over 102 F (39C)             F.  The patient vomited and now is having trouble breathing

## 2018-01-22 NOTE — Progress Notes (Signed)
NEUROLOGY FOLLOW UP OFFICE NOTE  LOTTA FRANKENFIELD [id number]  DOB: 01-19-1981  HISTORY OF PRESENT ILLNESS: I had the pleasure of seeing Coralynn Gaona in follow-up in the neurology clinic on 01/22/2018. The patient was last seen 6 months ago for seizures. 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 No further body jerking since March 2017. She was reporting lightheadedness and right-sided sensory symptoms. She had side effects on Lamotrigine. She was switched to extended release Keppra due to fatigue, she felt the extended-release formulation is controlling her seizures better. She again brings a calendar of symptoms. She is taking Keppra XR 1750mg  daily. She had 4 episodes in August 2018 where she would feel waves of lightheadedness, feeling unwell, "kidney" pain in lower back. In September 2018 she had 2 episodes of suddenly feeling nauseated, with tingling, lasting for several minutes. She had one event in October 2018 while giving a talk in class and feeling lightheaded and numb for a few minutes. In December she had 4 episodes of nausea, lightheadedness,right-sided numbness. In January 2019, she had a period for a week where she was nauseated constantly and wondered if it was a side effect of Keppra. Keppra level was done, which was mid-range (21). She suddenly felt much better mid-January like a light switch was turned off. She reports being extremely stressed out with work and not having any events during that time. She seemed to have more episodes/symptoms when she is relaxed or on break. She wonders if symptoms are caffeine-related, she drinks 2-3 cups of coffee in the morning. She has not noticed any relation to her menstrual period. She is awaiting word on repeat MRI brain with Dr. Kathyrn Sheriff for March 2019.   HPI 03/07/2016: This is a pleasant 37 yo LH woman with no significant past medical history presenting after new onset seizure last 02/12/2016.  She started feeling lightheaded and had a cramping pain in the right flank region. She reports having these burning/crampy pains with lightheadedness occurring around 2 times a year for the past 10 years. They would usually resolve after she would hydrate herself, so she went to get water, but became more lightheaded where she could barely walk. She sat down then lost all feeling on her right arm from the collar bone down to all her fingers. She had difficulty lifting her right arm. She denied any clonic activity at that time. Over the course of 45 minutes, symptoms would come in a cycle with right flank burning followed by numbness in her arm for a minute, then go away where she could again move her arm normally. She noticed she could not focus on things. Due to worsening symptoms, EMS was called, and in the ambulance she had an episode where she reported her head was forcefully turned to the left side and she could not straighten it. Her right leg was jerking. She could talk and comprehend the entire time. While she was sitting in the ER for 2 hours, she had the recurrent cyclical burning sensation over the right flank followed by right arm numbness lasting 1 minute occurring every 8 minutes. She denied any confusion but on ER notes she reported a sense of detachment and disorientation while in the ambulance. She denies any olfactory/gustatory hallucinations, deja vu, rising epigastric sensation, previous episodes of gaps in time or staring/unresponsiveness. In the ER, CBC and CMP were unremarkable. She had an MRI brain with and without contrast which I  personally reviewed, there was a 1.5 x 1.0 cm focus of T1 and T2 hypointensity and susceptibility artifact in the left cingulate gyrus with a small amount of T2 hyperintensity centrally within the lesion. No surrounding edema or mass effect, no abnormal enhancement. Hippocampi symmetric with no abnormal signal or enhancement. Lesion in the cingulate gyrus was  favored to represent a cavernoma. No evidence of recent hemorrhage. Her wake and drowsy EEG was normal. She was discharged home on Keppra 500mg  BID and denies any further similar symptoms since 02/12/16.   Epilepsy Risk Factors:  Left cingulate gyrus lesion suggestive of cavernoma. Otherwise she had a normal birth and early development.  There is no history of febrile convulsions, CNS infections such as meningitis/encephalitis, significant traumatic brain injury, neurosurgical procedures, or family history of seizures  I personally reviewed repeat MRI brain with and without contrast done 02/07/17 which showed stable left posterior cingulate gyrus 1.5cm lesion with signal characteristics most consistent with cavernoma.   PAST MEDICAL HISTORY: Past Medical History:  Diagnosis Date  . Cavernoma   . HSV (herpes simplex virus) anogenital infection   . Post partum thyroiditis   . Seizures ([place])     MEDICATIONS: Current Outpatient Medications on File Prior to Visit  Medication Sig Dispense Refill  . EPINEPHrine 0.3 mg/0.3 mL IJ SOAJ injection Inject 0.3 mLs (0.3 mg total) into the muscle once. Ok to substitue another brand injector 1 Device prn  . ibuprofen (ADVIL,MOTRIN) 200 MG tablet Take 200 mg by mouth every 6 (six) hours as needed for headache.    . levETIRAcetam (KEPPRA XR) 500 MG 24 hr tablet Take 2 tablets at night (take in addition with Levetiracetam ER 750mg  tablet for total of 1750mg  daily) 60 tablet 11  . Levetiracetam 750 MG TB24 Take 1 tablet at night (take with 2 tablets of Levetiracetam ER 500mg , for total of 1750mg  daily) 30 tablet 11  . LORazepam (ATIVAN) 0.5 MG tablet TAKE 1 TABLET FOR SEIZURES LASTING MORT THAN 15 MINS, MAY TAKE 2ND DOSE IF NEEDED. MAX 2/DAY 10 tablet 5  . Multiple Vitamins-Minerals (HM MULTIVITAMIN ADULT GUMMY PO) Take 1 each by mouth daily.     No current facility-administered medications on file prior to visit.     ALLERGIES: Allergies  Allergen Reactions    . Lamictal [Lamotrigine] Rash  . Meat Extract Hives and Other (See Comments)    Lamb - Red meat    FAMILY HISTORY: Family History  Problem Relation Age of Onset  . Hypertension Mother   . Hypothyroidism Mother   . Hypertension Father   . Transient ischemic attack Father   . Other Neg Hx     SOCIAL HISTORY: Social History   Socioeconomic History  . Marital status: Married    Spouse name: Not on file  . Number of children: Not on file  . Years of education: Not on file  . Highest education level: Not on file  Social Needs  . Financial resource strain: Not on file  . Food insecurity - worry: Not on file  . Food insecurity - inability: Not on file  . Transportation needs - medical: Not on file  . Transportation needs - non-medical: Not on file  Occupational History  . Not on file  Tobacco Use  . Smoking status: Never Smoker  . Smokeless tobacco: Never Used  Substance and Sexual Activity  . Alcohol use: No  . Drug use: No  . Sexual activity: Yes    Partners: Male  Birth control/protection: IUD  Other Topics Concern  . Not on file  Social History Narrative  . Not on file    REVIEW OF SYSTEMS: Constitutional: No fevers, chills, or sweats, no generalized fatigue, change in appetite Eyes: No visual changes, double vision, eye pain Ear, nose and throat: No hearing loss, ear pain, nasal congestion, sore throat Cardiovascular: No chest pain, palpitations Respiratory:  No shortness of breath at rest or with exertion, wheezes GastrointestinaI: No nausea, vomiting, diarrhea, abdominal pain, fecal incontinence Genitourinary:  No dysuria, urinary retention or frequency Musculoskeletal:  No neck pain, back pain Integumentary: No rash, pruritus, skin lesions Neurological: as above Psychiatric: No depression, insomnia, anxiety Endocrine: No palpitations, fatigue, diaphoresis, mood swings, change in appetite, change in weight, increased thirst Hematologic/Lymphatic:  No  anemia, purpura, petechiae. Allergic/Immunologic: no itchy/runny eyes, nasal congestion, recent allergic reactions, rashes  PHYSICAL EXAM: Vitals:   01/22/18 1538  BP: (!) 100/58  Pulse: 78  SpO2: 98%   General: No acute distress Head:  Normocephalic/atraumatic Neck: supple, no paraspinal tenderness, full range of motion Heart:  Regular rate and rhythm Lungs:  Clear to auscultation bilaterally Back: No paraspinal tenderness Skin/Extremities: No rash, no edema Neurological Exam: alert and oriented to person, place, and time. No aphasia or dysarthria. Fund of knowledge is appropriate.  Recent and remote memory are intact.  Attention and concentration are normal.    Able to name objects and repeat phrases. Cranial nerves: Pupils equal, round, reactive to light.  Extraocular movements intact with no nystagmus. Visual fields full. Facial sensation intact. No facial asymmetry. Tongue, uvula, palate midline.  Motor: Bulk and tone normal, muscle strength 5/5 throughout with no pronator drift.  Sensation to light touch intact.  No extinction to double simultaneous stimulation.  Deep tendon reflexes 2+ throughout, toes downgoing.  Finger to nose testing intact.  Gait narrow-based and steady, able to tandem walk adequately.  Romberg negative.  IMPRESSION: This is a pleasant 37 yo LH woman who presented with recurrent stereotyped episodes where she describes a burning sensation in her right flank, followed by right arm numbness and weakness. She was having multiple episodes on 02/12/16, at one point she had right leg clonic activity and head deviation to the left. MRI brain showed a left cingulate gyrus lesion suggestive of a cavernoma. Routine EEG normal. Symptoms suggestive of focal seizures arising from left-sided lesion. No further clonic activity since March 2017, she continues to report episodes of lightheadedness followed by numbness and tingling on the right side. She also reports waves of nausea,  some lasting for several hours, which is unusual for seizures. We discussed increasing Keppra dose slightly and assessing if there is an improvement in her symptoms. She would like to continue monitoring symptoms for now before making any medication changes. Continue Keppra XR 1750mg  daily. She is aware of [place] driving laws to stop driving after a seizure, until 6 months seizure-free. She is awaiting word from Dr. Cleotilde Neer office regarding her annual MRI brain. She will follow-up in 6 months and knows to call our office for any changes.   Thank you for allowing me to participate in her care.  Please do not hesitate to call for any questions or concerns.  The duration of this appointment visit was 25 minutes of face-to-face time with the patient.  Greater than 50% of this time was spent in counseling, explanation of diagnosis, planning of further management, and coordination of care.   Ellouise Newer, M.D.   CC: Dr. Lorelei Pont, Dr.  [employer]

## 2018-07-27 ENCOUNTER — Other Ambulatory Visit: Payer: Self-pay | Admitting: Neurology

## 2018-07-27 ENCOUNTER — Other Ambulatory Visit: Payer: Self-pay

## 2018-07-27 ENCOUNTER — Encounter: Payer: Self-pay | Admitting: Neurology

## 2018-07-27 ENCOUNTER — Ambulatory Visit (INDEPENDENT_AMBULATORY_CARE_PROVIDER_SITE_OTHER): Payer: BLUE CROSS/BLUE SHIELD | Admitting: Neurology

## 2018-07-27 VITALS — BP 120/56 | HR 83 | Ht 67.0 in | Wt 157.0 lb

## 2018-07-27 DIAGNOSIS — Q283 Other malformations of cerebral vessels: Secondary | ICD-10-CM | POA: Diagnosis not present

## 2018-07-27 DIAGNOSIS — G40109 Localization-related (focal) (partial) symptomatic epilepsy and epileptic syndromes with simple partial seizures, not intractable, without status epilepticus: Secondary | ICD-10-CM | POA: Diagnosis not present

## 2018-07-27 MED ORDER — LEVETIRACETAM ER 750 MG PO TB24: ORAL_TABLET | ORAL | 3 refills | Status: DC

## 2018-07-27 MED ORDER — LEVETIRACETAM ER 500 MG PO TB24: ORAL_TABLET | ORAL | 3 refills | Status: DC

## 2018-07-27 NOTE — Progress Notes (Signed)
NEUROLOGY FOLLOW UP OFFICE NOTE  JULANN MCGILVRAY [id number]  DOB: [birth year]  HISTORY OF PRESENT ILLNESS: I had the pleasure of seeing Anitra Doxtater in follow-up in the neurology clinic on 07/27/2018. The patient was last seen 6 months ago for seizures. 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 No further body jerking since March 2017. She continued to report episodes of feeling lightheaded, nausea, at times right-sided numbness. She is taking Keppra XR 1750mg  daily with no side effects. Since her last visit, she states that she has been doing very well with no symptoms to report since December 2018. This week she has been having minor sensation of movement and pins and needles, a little flank pain on the right, but nothing major. She still feels tired a lot and sleeps 9 hours at night, with low sex drive. She notices trouble with memory sometimes, but states she may be hyperfocusing on symptoms. She denies any headaches, dizziness, vision changes, no falls. No staring/unresponsive episodes, gaps in time, olfactory/gustatory hallucinations, myoclonic jerks.   History on Initial Assessment 03/07/2016: This is a pleasant 37 yo LH woman with no significant past medical history presenting after new onset seizure last 02/12/2016. She started feeling lightheaded and had a cramping pain in the right flank region. She reports having these burning/crampy pains with lightheadedness occurring around 2 times a year for the past 10 years. They would usually resolve after she would hydrate herself, so she went to get water, but became more lightheaded where she could barely walk. She sat down then lost all feeling on her right arm from the collar bone down to all her fingers. She had difficulty lifting her right arm. She denied any clonic activity at that time. Over the course of 45 minutes, symptoms would come in a cycle with right flank burning followed by numbness in  her arm for a minute, then go away where she could again move her arm normally. She noticed she could not focus on things. Due to worsening symptoms, EMS was called, and in the ambulance she had an episode where she reported her head was forcefully turned to the left side and she could not straighten it. Her right leg was jerking. She could talk and comprehend the entire time. While she was sitting in the ER for 2 hours, she had the recurrent cyclical burning sensation over the right flank followed by right arm numbness lasting 1 minute occurring every 8 minutes. She denied any confusion but on ER notes she reported a sense of detachment and disorientation while in the ambulance. She denies any olfactory/gustatory hallucinations, deja vu, rising epigastric sensation, previous episodes of gaps in time or staring/unresponsiveness. In the ER, CBC and CMP were unremarkable. She had an MRI brain with and without contrast which I personally reviewed, there was a 1.5 x 1.0 cm focus of T1 and T2 hypointensity and susceptibility artifact in the left cingulate gyrus with a small amount of T2 hyperintensity centrally within the lesion. No surrounding edema or mass effect, no abnormal enhancement. Hippocampi symmetric with no abnormal signal or enhancement. Lesion in the cingulate gyrus was favored to represent a cavernoma. No evidence of recent hemorrhage. Her wake and drowsy EEG was normal. She was discharged home on Keppra 500mg  BID and denies any further similar symptoms since 02/12/16.   Epilepsy Risk Factors:  Left cingulate gyrus lesion suggestive of cavernoma. Otherwise she had a normal birth and early  development.  There is no history of febrile convulsions, CNS infections such as meningitis/encephalitis, significant traumatic brain injury, neurosurgical procedures, or family history of seizures  I personally reviewed repeat MRI brain with and without contrast done 02/07/17 which showed stable left posterior cingulate  gyrus 1.5cm lesion with signal characteristics most consistent with cavernoma.   PAST MEDICAL HISTORY: Past Medical History:  Diagnosis Date  . Cavernoma   . HSV (herpes simplex virus) anogenital infection   . Post partum thyroiditis   . Seizures ([place])     MEDICATIONS: Current Outpatient Medications on File Prior to Visit  Medication Sig Dispense Refill  . EPINEPHrine 0.3 mg/0.3 mL IJ SOAJ injection Inject 0.3 mLs (0.3 mg total) into the muscle once. Ok to substitue another brand injector 1 Device prn  . ibuprofen (ADVIL,MOTRIN) 200 MG tablet Take 200 mg by mouth every 6 (six) hours as needed for headache.    Marland Kitchen LORazepam (ATIVAN) 0.5 MG tablet TAKE 1 TABLET FOR SEIZURES LASTING MORT THAN 15 MINS, MAY TAKE 2ND DOSE IF NEEDED. MAX 2/DAY 10 tablet 5  . Multiple Vitamins-Minerals (HM MULTIVITAMIN ADULT GUMMY PO) Take 1 each by mouth daily.     No current facility-administered medications on file prior to visit.     ALLERGIES: Allergies  Allergen Reactions  . Lamictal [Lamotrigine] Rash  . Meat Extract Hives and Other (See Comments)    Lamb - Red meat    FAMILY HISTORY: Family History  Problem Relation Age of Onset  . Hypertension Mother   . Hypothyroidism Mother   . Hypertension Father   . Transient ischemic attack Father   . Other Neg Hx     SOCIAL HISTORY: Social History   Socioeconomic History  . Marital status: Married    Spouse name: Not on file  . Number of children: Not on file  . Years of education: Not on file  . Highest education level: Not on file  Occupational History  . Not on file  Social Needs  . Financial resource strain: Not on file  . Food insecurity:    Worry: Not on file    Inability: Not on file  . Transportation needs:    Medical: Not on file    Non-medical: Not on file  Tobacco Use  . Smoking status: Never Smoker  . Smokeless tobacco: Never Used  Substance and Sexual Activity  . Alcohol use: No  . Drug use: No  . Sexual activity:  Yes    Partners: Male    Birth control/protection: IUD  Lifestyle  . Physical activity:    Days per week: Not on file    Minutes per session: Not on file  . Stress: Not on file  Relationships  . Social connections:    Talks on phone: Not on file    Gets together: Not on file    Attends religious service: Not on file    Active member of club or organization: Not on file    Attends meetings of clubs or organizations: Not on file    Relationship status: Not on file  . Intimate partner violence:    Fear of current or ex partner: Not on file    Emotionally abused: Not on file    Physically abused: Not on file    Forced sexual activity: Not on file  Other Topics Concern  . Not on file  Social History Narrative  . Not on file    REVIEW OF SYSTEMS: Constitutional: No fevers, chills, or sweats,  no generalized fatigue, change in appetite Eyes: No visual changes, double vision, eye pain Ear, nose and throat: No hearing loss, ear pain, nasal congestion, sore throat Cardiovascular: No chest pain, palpitations Respiratory:  No shortness of breath at rest or with exertion, wheezes GastrointestinaI: No nausea, vomiting, diarrhea, abdominal pain, fecal incontinence Genitourinary:  No dysuria, urinary retention or frequency Musculoskeletal:  No neck pain, back pain Integumentary: No rash, pruritus, skin lesions Neurological: as above Psychiatric: No depression, insomnia, anxiety Endocrine: No palpitations, fatigue, diaphoresis, mood swings, change in appetite, change in weight, increased thirst Hematologic/Lymphatic:  No anemia, purpura, petechiae. Allergic/Immunologic: no itchy/runny eyes, nasal congestion, recent allergic reactions, rashes  PHYSICAL EXAM: Vitals:   07/27/18 1501  BP: (!) 120/56  Pulse: 83  SpO2: 99%   General: No acute distress Head:  Normocephalic/atraumatic Neck: supple, no paraspinal tenderness, full range of motion Heart:  Regular rate and rhythm Lungs:   Clear to auscultation bilaterally Back: No paraspinal tenderness Skin/Extremities: No rash, no edema Neurological Exam: alert and oriented to person, place, and time. No aphasia or dysarthria. Fund of knowledge is appropriate.  Recent and remote memory are intact.  Attention and concentration are normal.    Able to name objects and repeat phrases. Cranial nerves: Pupils equal, round, reactive to light.  Extraocular movements intact with no nystagmus. Visual fields full. Facial sensation intact. No facial asymmetry. Tongue, uvula, palate midline.  Motor: Bulk and tone normal, muscle strength 5/5 throughout with no pronator drift.  Sensation to light touch intact.  No extinction to double simultaneous stimulation.  Deep tendon reflexes 2+ throughout, toes downgoing.  Finger to nose testing intact.  Gait narrow-based and steady, able to tandem walk adequately.  Romberg negative.  IMPRESSION: This is a pleasant 37 yo LH woman who presented with recurrent stereotyped episodes where she describes a burning sensation in her right flank, followed by right arm numbness and weakness. She was having multiple episodes on 02/12/16, at one point she had right leg clonic activity and head deviation to the left. MRI brain showed a left cingulate gyrus lesion suggestive of a cavernoma. Routine EEG normal. Symptoms suggestive of focal seizures arising from left-sided lesion. No further clonic activity since March 2017. She has been doing well with no significant symptoms since December 2018. Continue symptom calendar, refills for Keppra XR 1750mg  daily were sent. We had previously discussed that if symptoms continue, we can try increasing Keppra dose. She is aware of [place] driving laws to stop driving after a seizure, until 6 months seizure-free. She will follow-up in 6 months and knows to call our office for any changes.   Thank you for allowing me to participate in her care.  Please do not hesitate to call for any questions or  concerns.  The duration of this appointment visit was 15 minutes of face-to-face time with the patient.  Greater than 50% of this time was spent in counseling, explanation of diagnosis, planning of further management, and coordination of care.   Ellouise Newer, M.D.   CC: Dr. Lorelei Pont, Dr. Kathyrn Sheriff

## 2018-07-27 NOTE — Patient Instructions (Signed)
Great seeing you! Continue Keppra XR 1750mg  daily. Continue symptom calendar. Follow-up in 6 months, call for any changes.  Seizure Precautions: 1. If medication has been prescribed for you to prevent seizures, take it exactly as directed.  Do not stop taking the medicine without talking to your doctor first, even if you have not had a seizure in a long time.   2. Avoid activities in which a seizure would cause danger to yourself or to others.  Don't operate dangerous machinery, swim alone, or climb in high or dangerous places, such as on ladders, roofs, or girders.  Do not drive unless your doctor says you may.  3. If you have any warning that you may have a seizure, lay down in a safe place where you can't hurt yourself.    4.  No driving for 6 months from last seizure, as per [facility].   Please refer to the following link on the [facility] website for more information: http://www.epilepsyfoundation.org/answerplace/Social/driving/drivingu.cfm   5.  Maintain good sleep hygiene. Avoid alcohol.  6.  Notify your neurology if you are planning pregnancy or if you become pregnant.  7.  Contact your doctor if you have any problems that may be related to the medicine you are taking.  8.  Call 911 and bring the patient back to the ED if:        A.  The seizure lasts longer than 5 minutes.       B.  The patient doesn't awaken shortly after the seizure  C.  The patient has new problems such as difficulty seeing, speaking or moving  D.  The patient was injured during the seizure  E.  The patient has a temperature over 102 F (39C)  F.  The patient vomited and now is having trouble breathing

## 2018-08-07 ENCOUNTER — Telehealth: Payer: Self-pay | Admitting: Neurology

## 2018-08-07 ENCOUNTER — Other Ambulatory Visit: Payer: Self-pay | Admitting: Neurology

## 2018-08-07 DIAGNOSIS — G40109 Localization-related (focal) (partial) symptomatic epilepsy and epileptic syndromes with simple partial seizures, not intractable, without status epilepticus: Secondary | ICD-10-CM

## 2018-08-07 NOTE — Telephone Encounter (Signed)
Returned call.  No answer.  LMOM asking for return call.  

## 2018-08-07 NOTE — Telephone Encounter (Signed)
Patient called and left a voicemail stating that she had some questions concerning her Keppra medication. She was having some issues with her insurance. Please give her a call back at [phone]. Thanks.

## 2018-08-27 ENCOUNTER — Telehealth: Payer: Self-pay | Admitting: Neurology

## 2018-08-27 NOTE — Telephone Encounter (Signed)
Patient is wanting to [facility] her [facility] She is needing a refill on the Keppra 500mg  and the Levetiracetam 750mg  sent to them. If you need to call her it's [phone]. Thanks!

## 2018-08-28 ENCOUNTER — Other Ambulatory Visit: Payer: Self-pay

## 2018-08-28 DIAGNOSIS — G40109 Localization-related (focal) (partial) symptomatic epilepsy and epileptic syndromes with simple partial seizures, not intractable, without status epilepticus: Secondary | ICD-10-CM

## 2018-08-28 MED ORDER — LEVETIRACETAM ER 500 MG PO TB24: ORAL_TABLET | ORAL | 3 refills | Status: DC

## 2018-08-28 MED ORDER — LEVETIRACETAM ER 750 MG PO TB24: ORAL_TABLET | ORAL | 3 refills | Status: DC

## 2018-08-28 NOTE — Telephone Encounter (Signed)
Rx's sent to [facility].  

## 2018-08-31 ENCOUNTER — Telehealth: Payer: Self-pay

## 2018-08-31 NOTE — Telephone Encounter (Signed)
That is fine, thanks 

## 2018-08-31 NOTE — Telephone Encounter (Signed)
Received VM from pt stating that order for Keppra with [facility] may be slightly delayed.  States that she has 750mg  tabs on hand, and wondering if OK to take (2) 750mg  Tabs for total of 1500mg /day in the event that 500mg  Tabs do not come in in time.  pls advise.

## 2018-08-31 NOTE — Telephone Encounter (Signed)
LMOM advising that OK to take (2) 750mg  Tablets Keppra in the event there is delay in delivery

## 2018-09-15 ENCOUNTER — Telehealth: Payer: BLUE CROSS/BLUE SHIELD | Admitting: Nurse Practitioner

## 2018-09-15 DIAGNOSIS — H5789 Other specified disorders of eye and adnexa: Secondary | ICD-10-CM

## 2018-09-15 MED ORDER — POLYMYXIN B-TRIMETHOPRIM 10000-0.1 UNIT/ML-% OP SOLN: 2.0000 [drp] | OPHTHALMIC | 0 refills | Status: DC

## 2018-09-15 NOTE — Progress Notes (Signed)

## 2018-09-17 ENCOUNTER — Encounter: Payer: Self-pay | Admitting: Physician Assistant

## 2018-09-30 IMAGING — MR MR HEAD WO/W CM
11 of 12 series · 42 of 48 positions shown · IV contrast (multihance)
Comparison: 02/12/2016 MRI of the head.

CLINICAL DATA: 35 y/o F; left cingulate gyrus lesion favored to
represent cavernoma for follow-up.

EXAM:
MRI HEAD WITHOUT AND WITH CONTRAST
TECHNIQUE: Multiplanar, multiecho pulse sequences of the brain and surrounding
structures were obtained without and with intravenous contrast.
CONTRAST:  14mL MULTIHANCE GADOBENATE DIMEGLUMINE 529 MG/ML IV SOLN

[Series 2: T1 · sagittal · 5.0mm · 0.45mm/px · 1 of 23 slices shown]
[im 1/23]
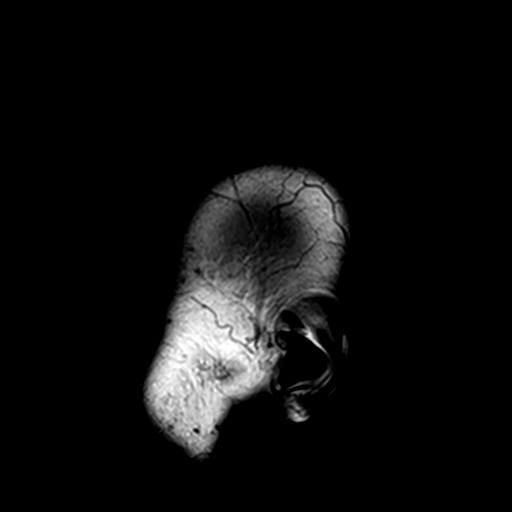

[Series 3: DWI · axial · 3.0mm · 1.80mm/px · z∈[-60,+87]mm · 7 of 100 slices shown (1 of 2)]
[im 1/100]
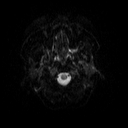
[im 17/100]
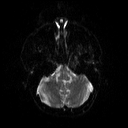
[im 34/100]
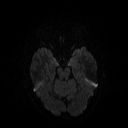
[im 50/100]
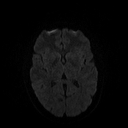
[im 67/100]
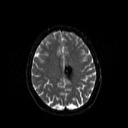
[im 83/100]
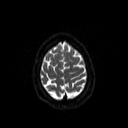
[im 100/100]
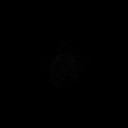

[Series 4: DWI · axial · 3.0mm · 1.80mm/px · z∈[-60,+87]mm · 3 of 49 slices shown (2 of 2)]
[im 1/49]
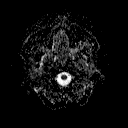
[im 25/49]
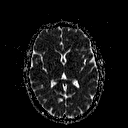
[im 49/49]
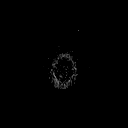

[Series 5: T2 · axial · 5.0mm · 0.51mm/px · z∈[-55,+86]mm · 2 of 22 slices shown (1 of 3)]
[im 1/22]
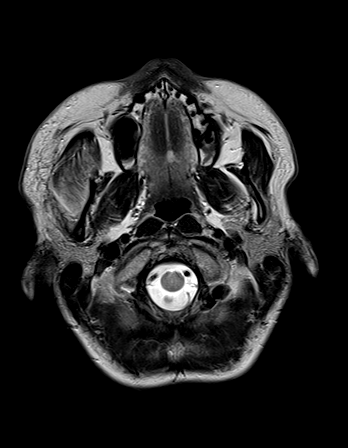
[im 22/22]
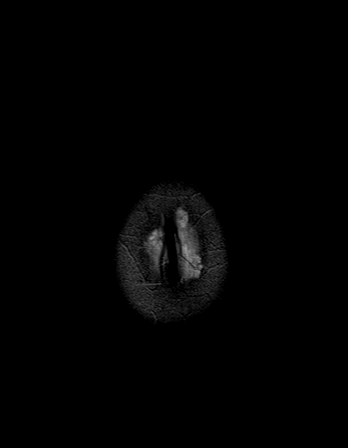

[Series 6: FLAIR · axial · 3.0mm · 0.45mm/px · z∈[-57,+84]mm · 3 of 48 slices shown]
[im 1/48]
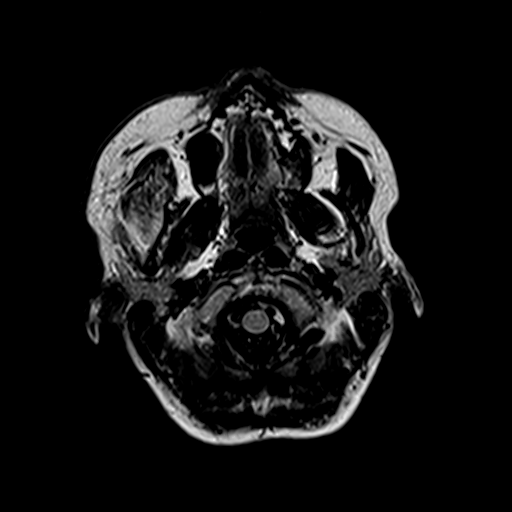
[im 24/48]
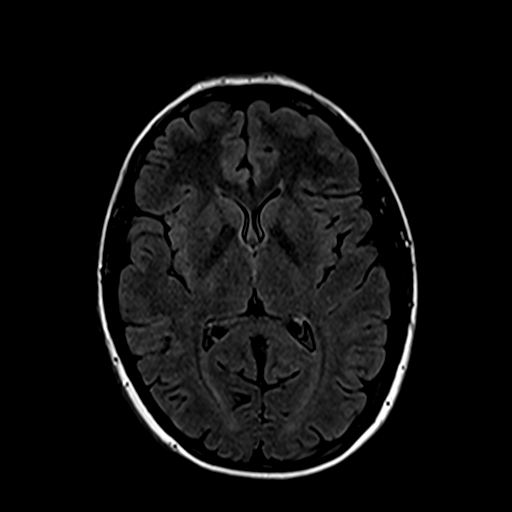
[im 48/48]
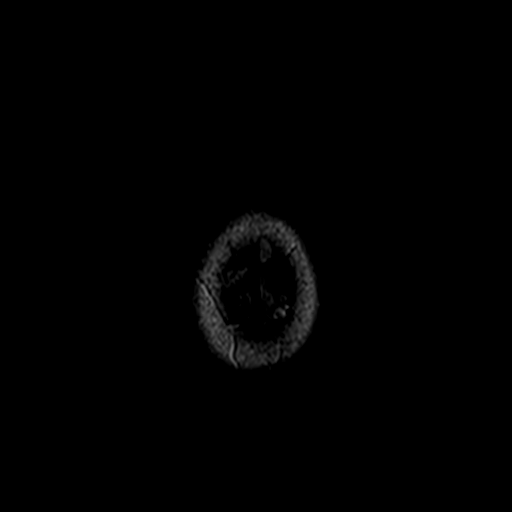

[Series 8: swi_images · axial · 2.0mm · 0.90mm/px · z∈[-65,+93]mm · 5 of 80 slices shown]
[im 1/80]
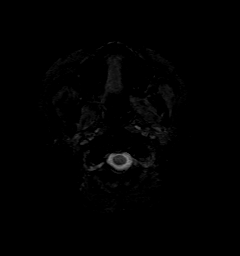
[im 20/80]
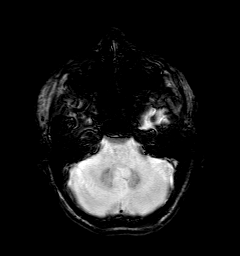
[im 40/80]
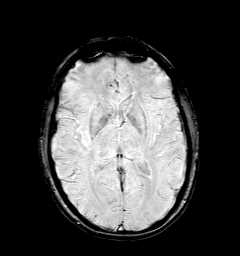
[im 60/80]
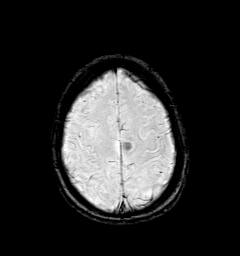
[im 80/80]
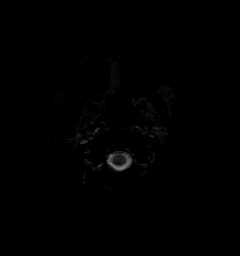

[Series 9: t1_mpr_tra · axial · 1.0mm · 0.75mm/px · z∈[-56,+86]mm · 8 of 144 slices shown (1 of 2)]
[im 1/144]
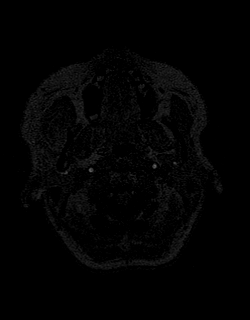
[im 16/144]
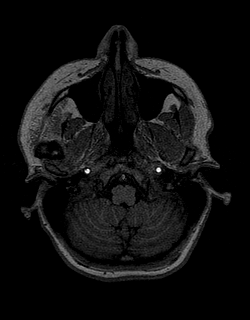
[im 48/144]
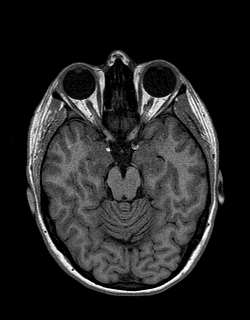
[im 64/144]
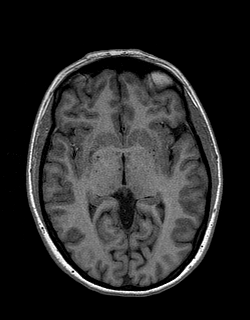
[im 80/144]
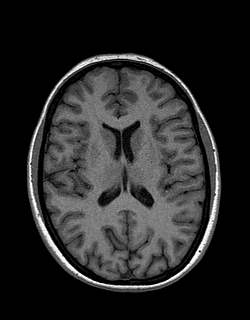
[im 96/144]
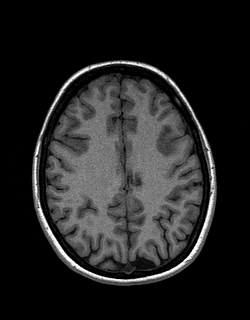
[im 128/144]
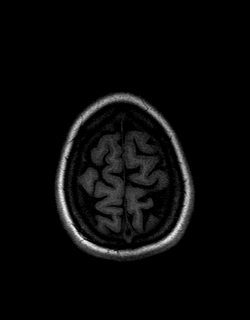
[im 144/144]
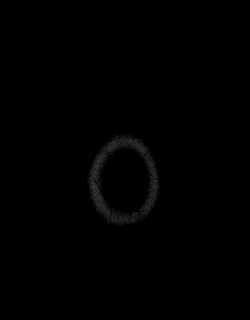

[Series 10: T2 · coronal · 5.0mm · 0.45mm/px · 2 of 25 slices shown (2 of 3)]
[im 1/25]
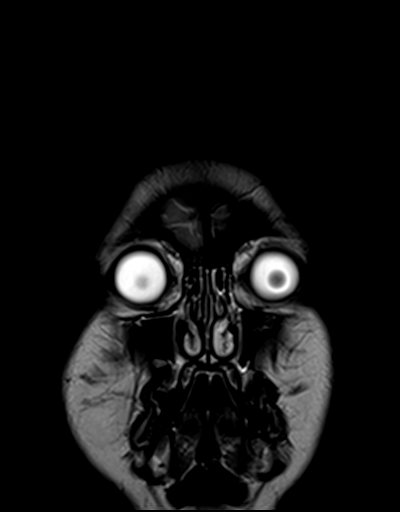
[im 25/25]
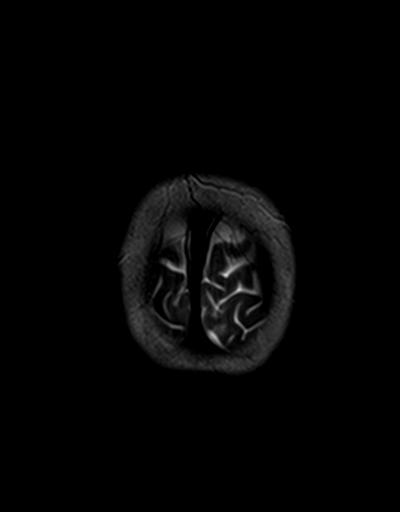

[Series 11: t1_mpr_tra · axial · 1.0mm · 0.75mm/px · z∈[-56,+86]mm · 8 of 144 slices shown (2 of 2)]
[im 1/144]
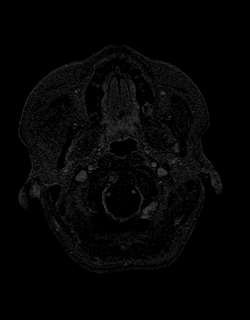
[im 16/144]
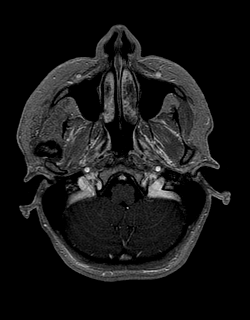
[im 48/144]
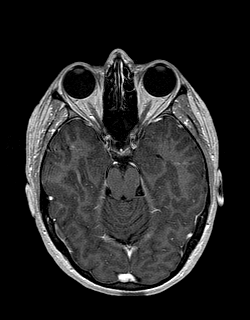
[im 64/144]
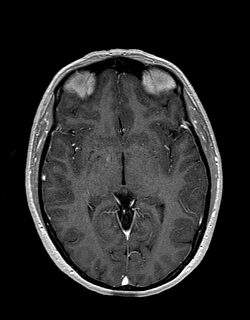
[im 80/144]
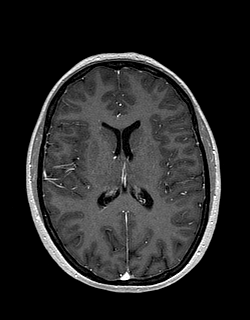
[im 96/144]
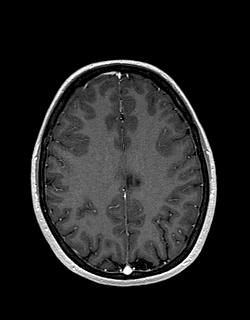
[im 128/144]
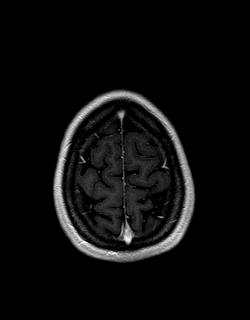
[im 144/144]
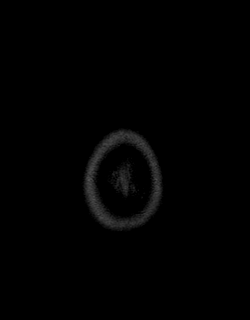

[Series 12: post cor · coronal · 5.0mm · 0.45mm/px · 2 of 25 slices shown]
[im 1/25]
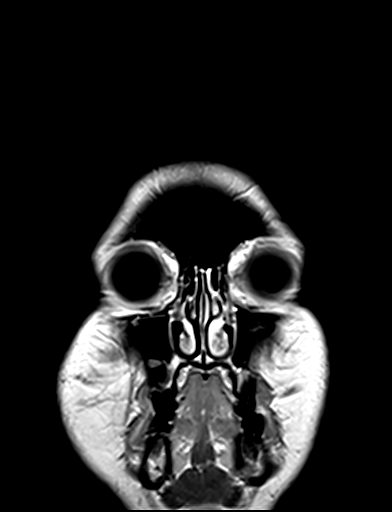
[im 25/25]
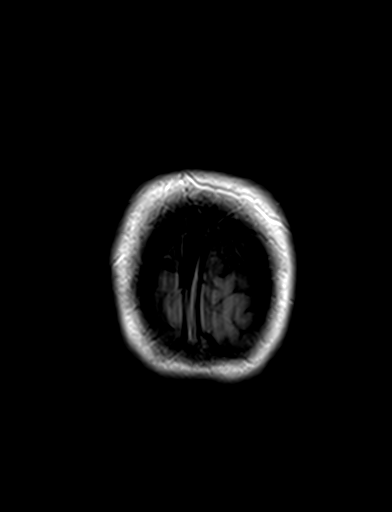

[Series 14: T2 · coronal · 3.0mm · 0.22mm/px · 1 of 20 slices shown (3 of 3)]
[im 1/20]
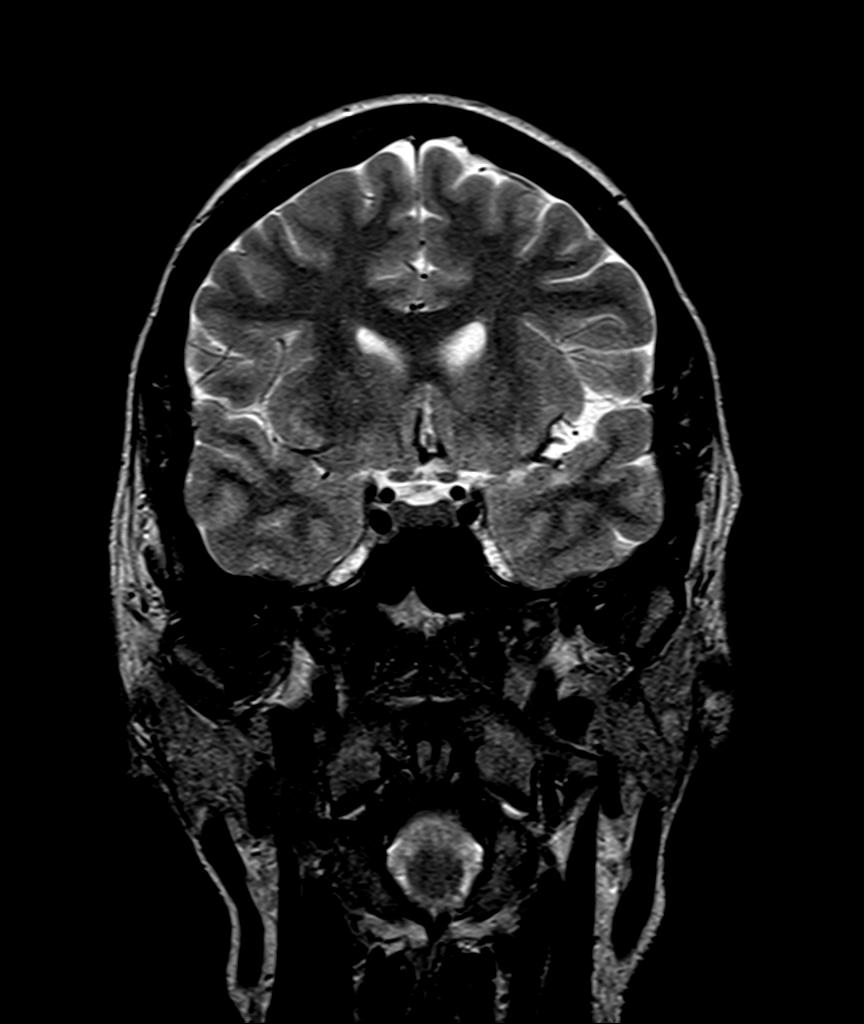

[42 of 48 positions shown; findings below may reference images not displayed]

FINDINGS: Brain: Lesion centered within the left cingulate gyrus with small
central T2 hyperintense foci and surrounding rim of low signal on
all sequences with susceptibility blooming is stable measuring up to
15 mm (series 2, image 13). No enhancement.

No diffusion signal abnormality to suggest acute or early subacute
infarct. No additional abnormal susceptibility hypointensity to
indicate intracranial hemorrhage. No focal mass effect. No
significant T2 FLAIR signal abnormality. No gross structural
abnormality of the brain. No extra-axial collection or
hydrocephalus.

Vascular: Normal flow voids.

Skull and upper cervical spine: Normal marrow signal.

Sinuses/Orbits: Negative.

Other: None.
IMPRESSION: 1. Stable left posterior cingulate gyrus 1.5 cm lesion with signal
characteristics most consistent with cavernoma.
2. Otherwise unremarkable MRI of the brain with and without
contrast.

By: Yoel Tiger M.D.

## 2018-12-11 ENCOUNTER — Other Ambulatory Visit: Payer: Self-pay

## 2018-12-11 DIAGNOSIS — G40109 Localization-related (focal) (partial) symptomatic epilepsy and epileptic syndromes with simple partial seizures, not intractable, without status epilepticus: Secondary | ICD-10-CM

## 2018-12-11 MED ORDER — LEVETIRACETAM ER 750 MG PO TB24: ORAL_TABLET | ORAL | 3 refills | Status: DC

## 2018-12-20 ENCOUNTER — Telehealth: Payer: Self-pay | Admitting: Neurology

## 2018-12-20 NOTE — Telephone Encounter (Signed)
[facility] called regarding this patient and needing an Alternative medication for generic Keppra. She said it is not available through the manufacturer until after 01/03/19. Please Call [phone]/ Ref # [id number]. Please Call. Thanks

## 2019-02-11 ENCOUNTER — Ambulatory Visit: Payer: Managed Care, Other (non HMO) | Admitting: Neurology

## 2019-02-24 ENCOUNTER — Emergency Department (HOSPITAL_COMMUNITY)
Admission: EM | Admit: 2019-02-24 | Discharge: 2019-02-24 | Disposition: A | Discharge status: Home / Self Care | Payer: BLUE CROSS/BLUE SHIELD | Attending: Emergency Medicine | Admitting: Emergency Medicine

## 2019-02-24 ENCOUNTER — Emergency Department (HOSPITAL_COMMUNITY): Payer: BLUE CROSS/BLUE SHIELD

## 2019-02-24 ENCOUNTER — Encounter (HOSPITAL_COMMUNITY): Payer: Self-pay | Admitting: Emergency Medicine

## 2019-02-24 ENCOUNTER — Other Ambulatory Visit: Payer: Self-pay

## 2019-02-24 DIAGNOSIS — Z79899 Other long term (current) drug therapy: Secondary | ICD-10-CM | POA: Diagnosis not present

## 2019-02-24 DIAGNOSIS — J069 Acute upper respiratory infection, unspecified: Secondary | ICD-10-CM | POA: Diagnosis not present

## 2019-02-24 DIAGNOSIS — R0789 Other chest pain: Secondary | ICD-10-CM

## 2019-02-24 LAB — BASIC METABOLIC PANEL
Anion gap: 6 (ref 5–15)
BUN: 14 mg/dL (ref 6–20)
CO2: 24 mmol/L (ref 22–32)
Calcium: 9 mg/dL (ref 8.9–10.3)
Chloride: 106 mmol/L (ref 98–111)
Creatinine, Ser: 0.77 mg/dL (ref 0.44–1.00)
GFR calc non Af Amer: >60 mL/min (ref >60)
Glucose, Bld: 110 mg/dL — ABNORMAL HIGH (ref 70–99)
Potassium: 3.4 mmol/L — ABNORMAL LOW (ref 3.5–5.1)
Sodium: 136 mmol/L (ref 135–145)

## 2019-02-24 LAB — CBC
HCT: 37.4 % (ref 36.0–46.0)
HEMOGLOBIN: 12.2 g/dL (ref 12.0–15.0)
MCH: 29.5 pg (ref 26.0–34.0)
MCHC: 32.6 g/dL (ref 30.0–36.0)
MCV: 90.3 fL (ref 80.0–100.0)
Platelets: 281 10*3/uL (ref 150–400)
RBC: 4.14 MIL/uL (ref 3.87–5.11)
RDW: 11.3 % — ABNORMAL LOW (ref 11.5–15.5)
WBC: 5.9 10*3/uL (ref 4.0–10.5)
nRBC: 0 % (ref 0.0–0.2)

## 2019-02-24 LAB — INFLUENZA PANEL BY PCR (TYPE A & B)
Influenza A By PCR: NEGATIVE
Influenza B By PCR: NEGATIVE

## 2019-02-24 LAB — I-STAT TROPONIN, ED: Troponin i, poc: 0 ng/mL (ref 0.00–0.08)

## 2019-02-24 LAB — I-STAT BETA HCG BLOOD, ED (MC, WL, AP ONLY): I-stat hCG, quantitative: <5 m[IU]/mL (ref <5)

## 2019-02-24 MED ORDER — SODIUM CHLORIDE 0.9% FLUSH: 3.0000 mL | Freq: Once | INTRAVENOUS | Status: DC

## 2019-02-24 MED ORDER — KETOROLAC TROMETHAMINE 30 MG/ML IJ SOLN
15.0000 mg | Freq: Once | INTRAMUSCULAR | Status: DC
  Filled 2019-02-24: qty 1

## 2019-02-24 NOTE — ED Notes (Signed)
Patient verbalizes understanding of discharge instructions. Opportunity for questioning and answers were provided. Armband removed by staff, pt discharged from ED ambulatory to home.  

## 2019-02-24 NOTE — ED Provider Notes (Signed)
[facility] EMERGENCY DEPARTMENT Provider Note   CSN: [id number] Arrival date & time: 02/24/19  1925    History   Chief Complaint Chief Complaint  Patient presents with   Chest Pain   Headache    HPI Carmen Perez is a 38 y.o. female with a past medical history of epilepsy currently on Keppra presents to ED for subjective fever, chest pain, sore throat.  States that she began feeling generalized body aches, subjective fever and sore throat since 02/14/2019.  A few days later she began having intermittent headache and sinus pressure behind both of her eyes.  Her husband and her daughter began having similar symptoms as well.  She slowly improved and then developed intermittent central chest pressure since yesterday.  She cannot recall any specific aggravating or alleviating factor.  She initially tried to self quarantine with her family.  However, her provider was concerned due to her chest pressure and sent her to the ED.  She has not been in contact with anyone that has tested positive for the novel coronavirus.  She denies shortness of breath.  She denies history of chest pain in the past.  She initially attributed to anxiety.  Denies any leg swelling, hormone use, cough, domino pain, headache, vision changes.  Denies any recent international or domestic travel.     HPI  Past Medical History:  Diagnosis Date   Cavernoma    HSV (herpes simplex virus) anogenital infection    Post partum thyroiditis    Seizures ([place])     Patient Active Problem List   Diagnosis Date Noted   Localization-related symptomatic epilepsy and epileptic syndromes with simple partial seizures, not intractable, without status epilepticus ([place]) 03/09/2016   Cerebral cavernoma 02/12/2016   Seizures ([place]) 02/12/2016   Postpartum care following vaginal delivery (6/26) 05/30/2012    Past Surgical History:  Procedure Laterality Date   KNEE DISLOCATION SURGERY Left Age 27    TONSILLECTOMY  2012     OB History    Gravida  1   Para  1   Term  1   Preterm      AB      Living  1     SAB      TAB      Ectopic      Multiple      Live Births  1            Home Medications    Prior to Admission medications   Medication Sig Start Date End Date Taking? Authorizing Provider  EPINEPHrine 0.3 mg/0.3 mL IJ SOAJ injection Inject 0.3 mLs (0.3 mg total) into the muscle once. Ok to substitue another brand injector Patient not taking: Reported on 07/27/2018 05/05/16   Copland, Gay Filler, MD  ibuprofen (ADVIL,MOTRIN) 200 MG tablet Take 200 mg by mouth every 6 (six) hours as needed for headache.    [provider]  levETIRAcetam (KEPPRA XR) 500 MG 24 hr tablet TAKE 2 TABLETS BY MOUTH AT NIGHT IN ADDITION TO LEVETIRACETAM 750MG  TABLET FOR TOTAL OF 1750MG  DAILY 08/28/18   Cameron Sprang, MD  Levetiracetam 750 MG TB24 Take 1 tablet at night (take with Keppra XR 500mg  2 tablets for a total of 1750mg  daily) 12/11/18   Cameron Sprang, MD  LORazepam (ATIVAN) 0.5 MG tablet TAKE 1 TABLET FOR SEIZURES LASTING MORT THAN 15 MINS, MAY TAKE 2ND DOSE IF NEEDED. MAX 2/DAY 12/11/17   Cameron Sprang, MD  Multiple Vitamins-Minerals (  HM MULTIVITAMIN ADULT GUMMY PO) Take 1 each by mouth daily.    [provider]  trimethoprim-polymyxin b (POLYTRIM) ophthalmic solution Place 2 drops into both eyes every 4 (four) hours. 09/15/18   Chevis Pretty, FNP    Family History Family History  Problem Relation Age of Onset   Hypertension Mother    Hypothyroidism Mother    Hypertension Father    Transient ischemic attack Father    Other Neg Hx     Social History Social History   Tobacco Use   Smoking status: Never Smoker   Smokeless tobacco: Never Used  Substance Use Topics   Alcohol use: No   Drug use: No     Allergies   Lamictal [lamotrigine] and Meat extract   Review of Systems Review of Systems  Constitutional: Positive for chills.  Negative for appetite change and fever.  HENT: Positive for ear pain and sore throat. Negative for rhinorrhea and sneezing.   Eyes: Negative for photophobia and visual disturbance.  Respiratory: Negative for cough, chest tightness, shortness of breath and wheezing.   Cardiovascular: Positive for chest pain. Negative for palpitations.  Gastrointestinal: Negative for abdominal pain, blood in stool, constipation, diarrhea, nausea and vomiting.  Genitourinary: Negative for dysuria, hematuria and urgency.  Musculoskeletal: Negative for myalgias.  Skin: Negative for rash.  Neurological: Negative for dizziness, weakness and light-headedness.     Physical Exam Updated Vital Signs BP 105/83    Pulse 72    Temp 98.1 F (36.7 C) (Oral)    Resp 19    LMP 02/15/2019    SpO2 100%   Physical Exam Vitals signs and nursing note reviewed.  Constitutional:      General: She is not in acute distress.    Appearance: She is well-developed.  HENT:     Head: Normocephalic and atraumatic.     Right Ear: A middle ear effusion is present.     Left Ear: A middle ear effusion is present.     Nose: Nose normal.     Right Sinus: No maxillary sinus tenderness or frontal sinus tenderness.     Left Sinus: No maxillary sinus tenderness or frontal sinus tenderness.     Mouth/Throat:     Pharynx: Oropharynx is clear. Uvula midline.     Tonsils: 0 on the right. 0 on the left.     Comments: Patient does not appear to be in acute distress. No trismus or drooling present. No pooling of secretions. Patient is tolerating secretions and is not in respiratory distress. No neck pain or tenderness to palpation of the neck. Full active and passive range of motion of the neck. No evidence of RPA or PTA. Eyes:     General: No scleral icterus.       Right eye: No discharge.        Left eye: No discharge.     Conjunctiva/sclera: Conjunctivae normal.  Neck:     Musculoskeletal: Normal range of motion and neck supple.      Comments: No meningismus. Cardiovascular:     Rate and Rhythm: Normal rate and regular rhythm.     Heart sounds: Normal heart sounds. No murmur. No friction rub. No gallop.   Pulmonary:     Effort: Pulmonary effort is normal. No respiratory distress.     Breath sounds: Normal breath sounds.  Abdominal:     General: Bowel sounds are normal. There is no distension.     Palpations: Abdomen is soft.  Tenderness: There is no abdominal tenderness. There is no guarding.  Musculoskeletal: Normal range of motion.     Comments: No lower extremity edema, erythema or calf tenderness bilaterally.  Skin:    General: Skin is warm and dry.     Findings: No rash.  Neurological:     Mental Status: She is alert.     Motor: No abnormal muscle tone.     Coordination: Coordination normal.      ED Treatments / Results  Labs (all labs ordered are listed, but only abnormal results are displayed) Labs Reviewed  BASIC METABOLIC PANEL - Abnormal; Notable for the following components:      Result Value   Potassium 3.4 (*)    Glucose, Bld 110 (*)    All other components within normal limits  CBC - Abnormal; Notable for the following components:   RDW 11.3 (*)    All other components within normal limits  INFLUENZA PANEL BY PCR (TYPE A & B)  I-STAT TROPONIN, ED  I-STAT BETA HCG BLOOD, ED ([facility], [facility], [facility] ONLY)    EKG None  Radiology Dg Chest 2 View  Result Date: 02/24/2019 CLINICAL DATA:  Chest pressure EXAM: CHEST - 2 VIEW COMPARISON:  None. FINDINGS: The lungs are clear without focal pneumonia, edema, pneumothorax or pleural effusion. The cardiopericardial silhouette is within normal limits for size. The visualized bony structures of the thorax are intact. Telemetry leads overlie the chest. IMPRESSION: No active cardiopulmonary disease. Electronically Signed   By: Misty Stanley M.D.   On: 02/24/2019 20:22    Procedures Procedures (including critical care time)  Medications Ordered in  ED Medications  sodium chloride flush (NS) 0.9 % injection 3 mL (3 mLs Intravenous Not Given 02/24/19 2006)  ketorolac (TORADOL) 30 MG/ML injection 15 mg (15 mg Intravenous Not Given 02/24/19 2120)     Initial Impression / Assessment and Plan / ED Course  I have reviewed the triage vital signs and the nursing notes.  Pertinent labs & imaging results that were available during my care of the patient were reviewed by me and considered in my medical decision making (see chart for details).        38 year old female with past medical history of epilepsy presents to ED for subjective fever, chest pain and sore throat.  She has had symptoms for approximately 10 days.  She began having generalized body aches, subjective fever and sore throat.  She then began having intermittent sinus pressure.  She began having chest pain yesterday.  She has been trying to self quarantine with her family who has had similar symptoms.  They have not been in contact with anyone that has tested positive for covid19 travel to any high risk areas.  Denies history of chest pain in the past but she was concerned that it could be due to anxiety.  On my exam she is overall well-appearing.  Lungs are clear to auscultation bilaterally.  No signs of RPA or PTA noted on my exam, she is tolerating secretions with no trismus or drooling.  No lower extremity edema, erythema or calf tenderness bilaterally that coming for DVT.  She is PERC negative.  She is low risk by heart score.  CBC, BMP, troponin, hCG unremarkable.  Flu swab is negative.  EKG shows normal sinus rhythm.  Chest x-ray shows no acute findings or concerning findings.  Her vital signs have remained reassuring.  She has declined medication to help with pressure.  Patient agrees that she will  continue self quarantine.  Explained to patient that due to current algorithm, covid 19 testing is not indicated at this time.  Patient is in agreement.  We will have her follow-up with PCP  and return to ED for any severe worsening symptoms.  Patient is hemodynamically stable, in NAD, and able to ambulate in the ED. Evaluation does not show pathology that would require ongoing emergent intervention or inpatient treatment. I explained the diagnosis to the patient. Pain has been managed and has no complaints prior to discharge. Patient is comfortable with above plan and is stable for discharge at this time. All questions were answered prior to disposition. Strict return precautions for returning to the ED were discussed. Encouraged follow up with PCP.    Portions of this note were generated with [employer]. Dictation errors may occur despite best attempts at proofreading.   Final Clinical Impressions(s) / ED Diagnoses   Final diagnoses:  Chest wall pain  Viral URI    ED Discharge Orders    None       Delia Heady, PA-C 02/24/19 2214    Drenda Freeze, MD 02/24/19 2233

## 2019-02-24 NOTE — ED Triage Notes (Signed)
C/o pressure to center of chest x 1 week.  States she had a fever 3/12-3/13.  Reports intermittent headache since then and sore throat.  Denies travel.

## 2019-02-24 NOTE — Discharge Instructions (Addendum)
Please continue with self quarantine for a total of 2 weeks. Continue OTC medications to help with your body aches. Follow up with your primary care provider or the primary care provider listed below if you do not have one. Return to the ED if you start to have worsening chest pain, develop shortness of breath, leg swelling, vomiting or coughing up blood.

## 2019-03-21 ENCOUNTER — Encounter: Payer: Self-pay | Admitting: General Surgery

## 2019-03-22 ENCOUNTER — Other Ambulatory Visit: Payer: Self-pay

## 2019-03-22 ENCOUNTER — Telehealth (INDEPENDENT_AMBULATORY_CARE_PROVIDER_SITE_OTHER): Payer: BLUE CROSS/BLUE SHIELD | Admitting: Neurology

## 2019-03-22 DIAGNOSIS — G40109 Localization-related (focal) (partial) symptomatic epilepsy and epileptic syndromes with simple partial seizures, not intractable, without status epilepticus: Secondary | ICD-10-CM

## 2019-03-22 DIAGNOSIS — Q283 Other malformations of cerebral vessels: Secondary | ICD-10-CM | POA: Diagnosis not present

## 2019-03-22 MED ORDER — LORAZEPAM 0.5 MG PO TABS: ORAL_TABLET | ORAL | 5 refills | Status: DC

## 2019-03-22 MED ORDER — LEVETIRACETAM ER 500 MG PO TB24: ORAL_TABLET | ORAL | 3 refills | Status: DC

## 2019-03-22 MED ORDER — LEVETIRACETAM ER 750 MG PO TB24: ORAL_TABLET | ORAL | 3 refills | Status: DC

## 2019-03-22 NOTE — Progress Notes (Signed)
Virtual Visit via Video Note The purpose of this virtual visit is to provide medical care while limiting exposure to the novel coronavirus.    Consent was obtained for video visit:  Yes.   Answered questions that patient had about telehealth interaction:  Yes.   I discussed the limitations, risks, security and privacy concerns of performing an evaluation and management service by telemedicine. I also discussed with the patient that there may be a patient responsible charge related to this service. The patient expressed understanding and agreed to proceed.  Pt location: Home Physician Location: office Name of referring provider:  No ref. provider found I connected with Carmen Perez at patients initiation/request on 03/22/2019 at  2:00 PM EDT by video enabled telemedicine application and verified that I am speaking with the correct person using two identifiers. Pt MRN:  [id number] Pt DOB:  09/20/1981 Video Participants:  Carmen Perez   History of Present Illness: The patient was last seen in August 2019 for seizures. Since her last visit, she had overall been doing well except for a short period in January when [employer] was late with sending her 750mg  dose. She is on Levetiracetam ER 1750mg  daily (taking 2 500mg  and 1 750mg  tablets), she took 1500mg  total dose for 2 days and states this messed her up for a week, she had intermittent light symptoms of nausea and right-sided tingling and had to take her rescue lorazepam on 1/7 because the symptoms were going on for a longer time. They calmed down for a while until 1/25 to 1/26 when her father was in the hospital and she felt nauseated. She has been doing well symptom-free since then. She denies any staring/unresponsive episodes, gaps in time, olfactory/gustatory hallucinations, myoclonic jerks, no falls. No headaches, dizziness, vision changes. 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   History on Initial Assessment  03/07/2016: This is a pleasant 38 yo LH woman with no significant past medical history presenting after new onset seizure last 02/12/2016. She started feeling lightheaded and had a cramping pain in the right flank region. She reports having these burning/crampy pains with lightheadedness occurring around 2 times a year for the past 10 years. They would usually resolve after she would hydrate herself, so she went to get water, but became more lightheaded where she could barely walk. She sat down then lost all feeling on her right arm from the collar bone down to all her fingers. She had difficulty lifting her right arm. She denied any clonic activity at that time. Over the course of 45 minutes, symptoms would come in a cycle with right flank burning followed by numbness in her arm for a minute, then go away where she could again move her arm normally. She noticed she could not focus on things. Due to worsening symptoms, EMS was called, and in the ambulance she had an episode where she reported her head was forcefully turned to the left side and she could not straighten it. Her right leg was jerking. She could talk and comprehend the entire time. While she was sitting in the ER for 2 hours, she had the recurrent cyclical burning sensation over the right flank followed by right arm numbness lasting 1 minute occurring every 8 minutes. She denied any confusion but on ER notes she reported a sense of detachment and disorientation while in the ambulance. She denies any olfactory/gustatory hallucinations, deja vu, rising epigastric sensation, previous episodes of gaps in time or staring/unresponsiveness. In  the ER, CBC and CMP were unremarkable. She had an MRI brain with and without contrast which I personally reviewed, there was a 1.5 x 1.0 cm focus of T1 and T2 hypointensity and susceptibility artifact in the left cingulate gyrus with a small amount of T2 hyperintensity centrally within the lesion. No surrounding edema or  mass effect, no abnormal enhancement. Hippocampi symmetric with no abnormal signal or enhancement. Lesion in the cingulate gyrus was favored to represent a cavernoma. No evidence of recent hemorrhage. Her wake and drowsy EEG was normal. She was discharged home on Keppra 500mg  BID and denies any further similar symptoms since 02/12/16.   Epilepsy Risk Factors:  Left cingulate gyrus lesion suggestive of cavernoma. Otherwise she had a normal birth and early development.  There is no history of febrile convulsions, CNS infections such as meningitis/encephalitis, significant traumatic brain injury, neurosurgical procedures, or family history of seizures  I personally reviewed repeat MRI brain with and without contrast done 02/07/17 which showed stable left posterior cingulate gyrus 1.5cm lesion with signal characteristics most consistent with cavernoma.    Observations/Objective:  Patient is awake, alert, oriented x 3. No aphasia or dysarthria. Intact fluency and comprehension. Remote and recent memory intact. Able to name and repeat. Cranial nerves: Extraocular movements intact with no nystagmus. No facial asymmetry. Motor: moves all extremities symmetrically. No incoordination on finger to nose testing. Gait: narrow-based and steady, able to tandem walk adequately. Negative Romberg test.  Assessment and Plan:   This is a pleasant 38 yo LH woman who presented with recurrent stereotyped episodes where she describes a burning sensation in her right flank, followed by right arm numbness and weakness. She was having multiple episodes on 02/12/16, at one point she had right leg clonic activity and head deviation to the left. MRI brain showed a left cingulate gyrus lesion suggestive of a cavernoma. Routine EEG normal. Symptoms suggestive of focal seizures arising from left-sided lesion. No further clonic activity since March 2017. She had mild symptoms with slight reduction in Keppra dose, now back on Keppra XR 1750mg   daily (taking 2 tablets of 500mg  and 1 tab of 750mg  dose), with no symptoms since end of January 2020. Follow-up MRI to be discussed with Dr. Kathyrn Sheriff. She is aware of [place] driving laws to stop driving after a seizure, until 6 months seizure-free. She will follow-up in 8 months and knows to call our office for any changes.    Follow Up Instructions:   -I discussed the assessment and treatment plan with the patient. The patient was provided an opportunity to ask questions and all were answered. The patient agreed with the plan and demonstrated an understanding of the instructions.   The patient was advised to call back or seek an in-person evaluation if the symptoms worsen or if the condition fails to improve as anticipated.

## 2019-08-20 ENCOUNTER — Other Ambulatory Visit: Payer: Self-pay | Admitting: Neurology

## 2019-08-20 ENCOUNTER — Other Ambulatory Visit: Payer: Self-pay

## 2019-08-20 DIAGNOSIS — G40109 Localization-related (focal) (partial) symptomatic epilepsy and epileptic syndromes with simple partial seizures, not intractable, without status epilepticus: Secondary | ICD-10-CM

## 2019-08-20 MED ORDER — LEVETIRACETAM ER 500 MG PO TB24: ORAL_TABLET | ORAL | 3 refills | Status: DC

## 2019-08-20 MED ORDER — LEVETIRACETAM ER 750 MG PO TB24: ORAL_TABLET | ORAL | 3 refills | Status: DC

## 2019-08-25 ENCOUNTER — Ambulatory Visit (INDEPENDENT_AMBULATORY_CARE_PROVIDER_SITE_OTHER)
Admission: RE | Admit: 2019-08-25 | Discharge: 2019-08-25 | Disposition: A | Discharge status: Home / Self Care | Payer: BC Managed Care – PPO | Source: Ambulatory Visit

## 2019-08-25 DIAGNOSIS — M25461 Effusion, right knee: Secondary | ICD-10-CM | POA: Diagnosis not present

## 2019-08-25 NOTE — Discharge Instructions (Signed)
You can try ibuprofen 600-800mg  three times a day for the next few days. Ice compress, elevation, knee sleeve during activity. Follow up with sports medicine/orthopedics for further evaluation if symptoms not improving.

## 2019-08-25 NOTE — ED Provider Notes (Signed)
Virtual Visit via Video Note:  Carmen Perez  initiated request for Telemedicine visit with [facility] Urgent [facility] team. I connected with Carmen Perez  on 08/25/2019 at 2:44 PM  for a synchronized telemedicine visit using a video enabled HIPPA compliant telemedicine application. I verified that I am speaking with Carmen Perez  using two identifiers.  Jodell Cipro, PA-C  was physically located in a [facility] Urgent care site and Carmen Perez was located at a different location.   The limitations of evaluation and management by telemedicine as well as the availability of in-person appointments were discussed. Patient was informed that she  may incur a bill ( including co-pay) for this virtual visit encounter. Carmen Perez  expressed understanding and gave verbal consent to proceed with virtual visit.     History of Present Illness:Carmen Perez  is a 38 y.o. female presents with 1 week history of right lateral knee swelling. Denies injury/trauma. States pain started after increase in activity. Denies pain, erythema, warmth. Area feels tight, but able to perform full ROM. Denies increase in swelling. Has been doing ice compress without much relief.   Past Medical History:  Diagnosis Date  . Cavernoma   . HSV (herpes simplex virus) anogenital infection   . Post partum thyroiditis   . Seizures (HCC)     Allergies  Allergen Reactions  . Lamictal [Lamotrigine] Rash  . Meat Extract Hives and Other (See Comments)    Lamb - Red meat        Observations/Objective: General: Well appearing, nontoxic, no acute distress. Sitting comfortably. Head: Normocephalic, atraumatic Eye: No conjunctival injection, eyelid swelling. EOMI ENT: Mucus membranes moist, no lip cracking. No obvious nasal drainage. Pulm: Speaking in full sentences without difficulty. Normal effort. No respiratory distress, accessory muscle use. MSK: mild swelling to the lateral knee, superior to knee joint. No  erythema, warmth. Full ROM.  Neuro: Normal mental status. Alert and oriented x 3.   Assessment and Plan: Discussed symptoms likely due to bursitis. Discussed use of NSAIDs for inflammation despite lack of pain. Discussed using knee sleeve and elevation. Return precautions given. Patient expresses understanding and agrees to plan.  Follow Up Instructions:    I discussed the assessment and treatment plan with the patient. The patient was provided an opportunity to ask questions and all were answered. The patient agreed with the plan and demonstrated an understanding of the instructions.   The patient was advised to call back or seek an in-person evaluation if the symptoms worsen or if the condition fails to improve as anticipated.  I provided 15 minutes of non-face-to-face time during this encounter.    Ok Edwards, PA-C  08/25/2019 2:44 PM         Ok Edwards, PA-C 08/25/19 1505

## 2019-09-11 ENCOUNTER — Encounter: Payer: Self-pay | Admitting: Neurology

## 2019-09-11 ENCOUNTER — Other Ambulatory Visit: Payer: Self-pay

## 2019-09-11 ENCOUNTER — Ambulatory Visit (INDEPENDENT_AMBULATORY_CARE_PROVIDER_SITE_OTHER): Payer: Managed Care, Other (non HMO) | Admitting: Neurology

## 2019-09-11 DIAGNOSIS — G40109 Localization-related (focal) (partial) symptomatic epilepsy and epileptic syndromes with simple partial seizures, not intractable, without status epilepticus: Secondary | ICD-10-CM

## 2019-09-11 MED ORDER — LORAZEPAM 0.5 MG PO TABS: ORAL_TABLET | ORAL | 5 refills | Status: DC

## 2019-09-11 MED ORDER — LEVETIRACETAM ER 500 MG PO TB24: ORAL_TABLET | ORAL | 3 refills | Status: DC

## 2019-09-11 MED ORDER — LEVETIRACETAM ER 750 MG PO TB24: ORAL_TABLET | ORAL | 3 refills | Status: DC

## 2019-09-11 NOTE — Progress Notes (Signed)
NEUROLOGY FOLLOW UP OFFICE NOTE  MORENIKE PERCELL PH:[id number] 1981/09/24  HISTORY OF PRESENT ILLNESS: I had the pleasure of seeing Carmen Perez in follow-up in the neurology clinic on 09/11/2019.  The patient was last seen 6 months ago for seizures. She has been doing well, with no further seizures since January 2020 when she had less medication due to pharmacy issues for a week. Since then, she denies any further episodes of nausea with right-sided tingling, no confusion, staring/unresponsiveness, gaps in time, olfactory/gustatory hallucinations, myoclonic jerks. She has not needed the prn lorazepam. No headaches, dizziness, vision changes, no falls. Mood is good. She is on Levetiracetam ER 1750mg  daily (taking 2 tabs of 500mg  dose and 1 tab 750mg ) without side effects. 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  History on Initial Assessment 03/07/2016: This is a pleasant 38 yo LH woman with no significant past medical history presenting after new onset seizure last 02/12/2016. She started feeling lightheaded and had a cramping pain in the right flank region. She reports having these burning/crampy pains with lightheadedness occurring around 2 times a year for the past 10 years. They would usually resolve after she would hydrate herself, so she went to get water, but became more lightheaded where she could barely walk. She sat down then lost all feeling on her right arm from the collar bone down to all her fingers. She had difficulty lifting her right arm. She denied any clonic activity at that time. Over the course of 45 minutes, symptoms would come in a cycle with right flank burning followed by numbness in her arm for a minute, then go away where she could again move her arm normally. She noticed she could not focus on things. Due to worsening symptoms, EMS was called, and in the ambulance she had an episode where she reported her head was forcefully turned to the left side and she could not  straighten it. Her right leg was jerking. She could talk and comprehend the entire time. While she was sitting in the ER for 2 hours, she had the recurrent cyclical burning sensation over the right flank followed by right arm numbness lasting 1 minute occurring every 8 minutes. She denied any confusion but on ER notes she reported a sense of detachment and disorientation while in the ambulance. She denies any olfactory/gustatory hallucinations, deja vu, rising epigastric sensation, previous episodes of gaps in time or staring/unresponsiveness. In the ER, CBC and CMP were unremarkable. She had an MRI brain with and without contrast which I personally reviewed, there was a 1.5 x 1.0 cm focus of T1 and T2 hypointensity and susceptibility artifact in the left cingulate gyrus with a small amount of T2 hyperintensity centrally within the lesion. No surrounding edema or mass effect, no abnormal enhancement. Hippocampi symmetric with no abnormal signal or enhancement. Lesion in the cingulate gyrus was favored to represent a cavernoma. No evidence of recent hemorrhage. Her wake and drowsy EEG was normal. She was discharged home on Keppra 500mg  BID and denies any further similar symptoms since 02/12/16.   Epilepsy Risk Factors:  Left cingulate gyrus lesion suggestive of cavernoma. Otherwise she had a normal birth and early development.  There is no history of febrile convulsions, CNS infections such as meningitis/encephalitis, significant traumatic brain injury, neurosurgical procedures, or family history of seizures  I personally reviewed repeat MRI brain with and without contrast done 02/07/17 which showed stable left posterior cingulate gyrus 1.5cm lesion with signal characteristics most consistent  with cavernoma.   PAST MEDICAL HISTORY: Past Medical History:  Diagnosis Date  . Cavernoma   . HSV (herpes simplex virus) anogenital infection   . Post partum thyroiditis   . Seizures ([place])     MEDICATIONS: Current  Outpatient Medications on File Prior to Visit  Medication Sig Dispense Refill  . EPINEPHrine 0.3 mg/0.3 mL IJ SOAJ injection Inject 0.3 mLs (0.3 mg total) into the muscle once. Ok to substitue another brand injector (Patient not taking: Reported on 07/27/2018) 1 Device prn  . ibuprofen (ADVIL,MOTRIN) 200 MG tablet Take 200 mg by mouth every 6 (six) hours as needed for headache.    . levETIRAcetam (KEPPRA XR) 500 MG 24 hr tablet TAKE 2 TABLETS BY MOUTH AT NIGHT IN ADDITION TO LEVETIRACETAM 750MG  TABLET FOR TOTAL OF 1750MG  DAILY 180 tablet 3  . Levetiracetam 750 MG TB24 TAKE 1 TABLET AT NIGHT (TAKE WITH 2 TABLETS OF LEVETIRACETAM ER 500MG , FOR TOTAL OF 1750MG  DAILY) 30 tablet 11  . Levetiracetam 750 MG TB24 Take 1 tablet at night (take with Keppra XR 500mg  2 tablets for a total of 1750mg  daily) 90 tablet 3  . LORazepam (ATIVAN) 0.5 MG tablet Take as needed for seizure 10 tablet 5  . Multiple Vitamins-Minerals (HM MULTIVITAMIN ADULT GUMMY PO) Take 1 each by mouth daily.    Marland Kitchen trimethoprim-polymyxin b (POLYTRIM) ophthalmic solution Place 2 drops into both eyes every 4 (four) hours. 10 mL 0   No current facility-administered medications on file prior to visit.     ALLERGIES: Allergies  Allergen Reactions  . Lamictal [Lamotrigine] Rash  . Meat Extract Hives and Other (See Comments)    Lamb - Red meat    FAMILY HISTORY: Family History  Problem Relation Age of Onset  . Hypertension Mother   . Hypothyroidism Mother   . Hypertension Father   . Transient ischemic attack Father   . Other Neg Hx     SOCIAL HISTORY: Social History   Socioeconomic History  . Marital status: Married    Spouse name: Not on file  . Number of children: Not on file  . Years of education: Not on file  . Highest education level: Not on file  Occupational History  . Not on file  Social Needs  . Financial resource strain: Not on file  . Food insecurity    Worry: Not on file    Inability: Not on file  .  Transportation needs    Medical: Not on file    Non-medical: Not on file  Tobacco Use  . Smoking status: Never Smoker  . Smokeless tobacco: Never Used  Substance and Sexual Activity  . Alcohol use: No  . Drug use: No  . Sexual activity: Yes    Partners: Male    Birth control/protection: I.U.D.  Lifestyle  . Physical activity    Days per week: Not on file    Minutes per session: Not on file  . Stress: Not on file  Relationships  . Social Herbalist on phone: Not on file    Gets together: Not on file    Attends religious service: Not on file    Active member of club or organization: Not on file    Attends meetings of clubs or organizations: Not on file    Relationship status: Not on file  . Intimate partner violence    Fear of current or ex partner: Not on file    Emotionally abused: Not on file  Physically abused: Not on file    Forced sexual activity: Not on file  Other Topics Concern  . Not on file  Social History Narrative  . Not on file    REVIEW OF SYSTEMS: Constitutional: No fevers, chills, or sweats, no generalized fatigue, change in appetite Eyes: No visual changes, double vision, eye pain Ear, nose and throat: No hearing loss, ear pain, nasal congestion, sore throat Cardiovascular: No chest pain, palpitations Respiratory:  No shortness of breath at rest or with exertion, wheezes GastrointestinaI: No nausea, vomiting, diarrhea, abdominal pain, fecal incontinence Genitourinary:  No dysuria, urinary retention or frequency Musculoskeletal:  No neck pain, back pain Integumentary: No rash, pruritus, skin lesions Neurological: as above Psychiatric: No depression, insomnia, anxiety Endocrine: No palpitations, fatigue, diaphoresis, mood swings, change in appetite, change in weight, increased thirst Hematologic/Lymphatic:  No anemia, purpura, petechiae. Allergic/Immunologic: no itchy/runny eyes, nasal congestion, recent allergic reactions, rashes   PHYSICAL EXAM: Vitals:   09/11/19 0837  BP: 109/75  Pulse: 86   General: No acute distress Head:  Normocephalic/atraumatic Skin/Extremities: No rash, no edema Neurological Exam: alert and oriented to person, place, and time. No aphasia or dysarthria. Fund of knowledge is appropriate.  Recent and remote memory are intact.  Attention and concentration are normal.    Able to name objects and repeat phrases. Cranial nerves: Pupils equal, round, reactive to light. Extraocular movements intact with no nystagmus. Visual fields full. Facial sensation intact. No facial asymmetry. Tongue, uvula, palate midline.  Motor: Bulk and tone normal, muscle strength 5/5 throughout with no pronator drift. Deep tendon reflexes +2 throughout, toes downgoing.  Finger to nose testing intact.  Gait narrow-based and steady, able to tandem walk adequately.  Romberg negative.  IMPRESSION: This is a pleasant 38 yo LH woman who presented with recurrent stereotyped episodes where she describes a burning sensation in her right flank, followed by right arm numbness and weakness. She was having multiple episodes on 02/12/16, at one point she had right leg clonic activity and head deviation to the left. MRI brain showed a left cingulate gyrus lesion suggestive of a cavernoma. Routine EEG normal. Symptoms suggestive of focal seizures arising from left-sided lesion. No further clonic activity since March 2017. She denies any sensory seizures since January 2020. Continue Levetiracetam ER 1750mg  daily (taking 2 tablets of 500mg  and 1 tab of 750mg  dose). She has prn lorazepam for seizure rescue. She will call Dr. Cleotilde Neer office to inquire about follow-up MRI frequency. She is aware of [place] driving laws to stop driving after a seizure, until 6 months seizure-free. She will follow-up in 8 months and knows to call our office for any changes.    Thank you for allowing me to participate in her care.  Please do not hesitate to call for any  questions or concerns.   Ellouise Newer, M.D.   CC: Dr. Lorelei Pont

## 2019-09-11 NOTE — Patient Instructions (Signed)
Great seeing you! Continue all medications. Follow-up in 8 months, call for any changes  Seizure Precautions: 1. If medication has been prescribed for you to prevent seizures, take it exactly as directed.  Do not stop taking the medicine without talking to your doctor first, even if you have not had a seizure in a long time.   2. Avoid activities in which a seizure would cause danger to yourself or to others.  Don't operate dangerous machinery, swim alone, or climb in high or dangerous places, such as on ladders, roofs, or girders.  Do not drive unless your doctor says you may.  3. If you have any warning that you may have a seizure, lay down in a safe place where you can't hurt yourself.    4.  No driving for 6 months from last seizure, as per [facility].   Please refer to the following link on the [place] website for more information: http://[email or web address]/[email or web address]/drivingu.cfm   5.  Maintain good sleep hygiene. Avoid alcohol.  6.  Notify your neurology if you are planning pregnancy or if you become pregnant.  7.  Contact your doctor if you have any problems that may be related to the medicine you are taking.  8.  Call 911 and bring the patient back to the ED if:        A.  The seizure lasts longer than 5 minutes.       B.  The patient doesn't awaken shortly after the seizure  C.  The patient has new problems such as difficulty seeing, speaking or moving  D.  The patient was injured during the seizure  E.  The patient has a temperature over 102 F (39C)  F.  The patient vomited and now is having trouble breathing

## 2019-09-23 DIAGNOSIS — G40909 Epilepsy, unspecified, not intractable, without status epilepticus: Secondary | ICD-10-CM | POA: Insufficient documentation

## 2019-12-03 ENCOUNTER — Telehealth: Payer: Managed Care, Other (non HMO) | Admitting: Physician Assistant

## 2019-12-03 ENCOUNTER — Inpatient Hospital Stay: Admission: RE | Admit: 2019-12-03 | Payer: Managed Care, Other (non HMO) | Source: Ambulatory Visit

## 2019-12-03 DIAGNOSIS — T300 Burn of unspecified body region, unspecified degree: Secondary | ICD-10-CM | POA: Diagnosis not present

## 2019-12-03 MED ORDER — SILVER SULFADIAZINE 1 % EX CREA: 1.0000 "application " | TOPICAL_CREAM | Freq: Every day | CUTANEOUS | 0 refills | Status: DC

## 2019-12-03 NOTE — Progress Notes (Signed)
E-VISIT for Burn  We are sorry that you are not feeling well. Here is how we plan to help!  Based on what you have shared with me it looks like you may have: 1st degree burn - usually peels like a sunburn in about a week.  The skin should look nearly normal after 2 weeks.    Based on your assessment:  I have prescribed Silvadene 1% cream.  Apply with gloves to affected area 1-2 times a day.  Apply a non-stick dressing such as Telfa to the site after you apply the cream.  You may hold the dressing in place with either paper tape or self adhering wrap such as Coban.  Product similar to Telfa and Coban can be bought at any pharmacy.  If you have a question ask your pharmacist.  [place] Burow are a type of painful wound caused by thermal, electrical, chemical, or electromagnetic energy.  Smoking and open flame are the leading cause of burn injury for older adults.  Scalding from a hot liquid is the leading cause of burn injury for children.  Both infants and older adults are the greatest risk for burn injury.  First degree burns effect only the outer layers of the skin.  The burn may be red and painful but the skin does not blister.  Long term tissue damage is rare.  Second degree burns involve the surface of the skin and the adjacent skin layers.  The burn sire also appears red and painful and the skin often swells and/or blisters.  Third degree burns destroy both layers of the skin and can also penetrate to underlying  Structures.  A third degree burn may not initially hurt because nerve endings were destroyed.  All third degree burns should be evaluated in person.  HOME CARE:   Wash the area gently with soap and water once a day  Apply antibiotic ointment directly to a Band-Aid or dressing and apply Band-Aid or dressing over the burn.  Change dressing every other day.  Use warm water and 1 or 2 wipes with a wet washcloth to remove any surface debris.  Some of the newer antibiotic ointments contain  lidocaine that can help to control the localized pain of the burn.  You should leave intact blisters alone for the first 7 days.  After a week you may gently remove blisters.  The easiest way to do this is gently wipe away the dead skin with wet gauze or wet washcloth.  If that fails you may carefully trim off the dead skin with a pair of fine scissors.  Be sure to clean the scissors in alcohol before use.  GET HELP RIGHT AWAY IF:  The area of the burn is larger than 4 palms of our hand.  You become short of breath.  The site looks infected.  Your symptoms persist after you have completed your treatment plan.  The burn has not healed in 14 days.   MAKE SURE YOU:  Understand these instructions.  Will watch your condition.  Will get help right away if you are not doing well or get worse.  Your e-visit answers were reviewed by a board certified advanced clinical practitioner to complete your personal care plan.  Depending upon the condition, your plan could have included both over the counter or prescription medications.    Please review your pharmacy choice.  Make sure the pharmacy is open so you can pick up prescription now.   If there is a problem, you  may contact your provider through [employer] and have the prescription routed to another pharmacy. Your safety is important to [place].  If you have drug allergies check your prescription carefully.    For the next 24 hours you can use MyChart to ask questions about today's visit, request a non-urgent call back, or ask for a work or school excuse.  You will get an email in the next 2 days asking about your experience.  I hope that your e-visit has been valuable and will speed your recovery.  Greater than 5 minutes, yet less than 10 minutes of time have been spent researching, coordinating and implementing care for this patient today.

## 2019-12-27 ENCOUNTER — Telehealth: Payer: Managed Care, Other (non HMO) | Admitting: Nurse Practitioner

## 2019-12-27 DIAGNOSIS — B353 Tinea pedis: Secondary | ICD-10-CM

## 2019-12-27 MED ORDER — ITRACONAZOLE 200 MG PO TABS: 200.0000 mg | ORAL_TABLET | Freq: Every day | ORAL | 0 refills | Status: AC

## 2019-12-27 NOTE — Progress Notes (Signed)
E-Visit for Athlete's Foot  We are sorry that you are not feeling well. Here is how we plan to help!  Based on what you shared with me it looks like you have tinea pedis, or "Athlete's Foot".  This type of rash can spread through shared towels, clothing, bedding, etc., as well as hard surfaces (particularly in moist areas) such as shower stalls, locker room floors, pool areas, etc. The symptoms of Athlete's Foot include red, swollen, peeling, itchy skin between the toes (especially between the pinky toe and the one next to it). The sole and heel of the foot may also be affected. In severe cases, the skin on the feet can blister.  Athlete's foot can usually be treated with over-the-counter topical antifungal products; but sometimes with chronic or extensive tinea pedis, prescription oral medications are needed.   Prescription medications are only indicated for an extensive rash or if over the counter treatments have failed.  I am prescribing:Itraconazole 200 mg once per day for one week  HOME CARE:  . Keep feet clean, dry, and cool. . Avoid using swimming pools, public showers, or foot baths. . Wear sandals when possible or air shoes out by alternating them every 2-3 days. . Avoid wearing closed shoes and wearing socks made from fabric that doesn't dry easily (for example, nylon). . Treat the infection with recommended medication  GET HELP RIGHT AWAY IF:  . Symptoms that don't go away after treatment. . Severe itching that persists. . If your rash spreads or swells. . If your rash begins to have drainage or smell. . You develop a fever.  MAKE SURE YOU   . Understand these instructions. . Will watch your condition. . Will get help right away if you are not doing well or get worse.   Thank you for choosing an e-visit.  Your e-visit answers were reviewed by a board certified advanced clinical practitioner to complete your personal care plan. Depending upon the condition,  your plan could have included both over the counter or prescription medications.  Please review your pharmacy choice. Make sure the pharmacy is open so you can pick up prescription now. If there is a problem, you may contact your provider through [employer] and have the prescription routed to another pharmacy.  Your safety is important to [place]. If you have drug allergies check your prescription carefully.   For the next 24 hours you can use MyChart to ask questions about today's visit, request a non-urgent call back, or ask for a work or school excuse.  You will get an email in the next two days asking about your experience. I hope that your e-visit has been valuable and will speed your recovery  References or for more information:  [employer]?search=[email or web address]%20treatment&source=search_result&selectedTitle=1~104&usage_type=default&display_rank=1  [email or web address]  I have spent at least 5 minutes reviewing and documenting in the patient's chart.

## 2020-05-12 ENCOUNTER — Telehealth: Payer: 59 | Admitting: Emergency Medicine

## 2020-05-12 DIAGNOSIS — H9201 Otalgia, right ear: Secondary | ICD-10-CM

## 2020-05-12 MED ORDER — CIPROFLOXACIN-DEXAMETHASONE 0.3-0.1 % OT SUSP: 4.0000 [drp] | Freq: Two times a day (BID) | OTIC | 0 refills | Status: DC

## 2020-05-12 MED ORDER — NEOMYCIN-POLYMYXIN-HC 3.5-10000-1 OT SOLN: 3.0000 [drp] | Freq: Four times a day (QID) | OTIC | 0 refills | Status: DC

## 2020-05-12 NOTE — Progress Notes (Signed)
Time spent: 10 min  E Visit for Swimmer's Ear  We are sorry that you are not feeling well. Here is how we plan to help!  I have prescribed: Ciprofloxin 0.2% and hydrocortisone 1% otic suspension 3 drops in affected ears twice daily for 7 days  In certain cases swimmer's ear may progress to a more serious bacterial infection of the middle or inner ear.  If you have a fever 102 and up and significantly worsening symptoms, this could indicate a more serious infection moving to the middle/inner and needs face to face evaluation in an office by a provider.  Your symptoms should improve over the next 3 days and should resolve in about 7 days.  HOME CARE:   Wash your hands frequently.  Do not place the tip of the bottle on your ear or touch it with your fingers.  You can take Acetominophen 650 mg every 4-6 hours as needed for pain.  If pain is severe or moderate, you can apply a heating pad (set on low) or hot water bottle (wrapped in a towel) to outer ear for 20 minutes.  This will also increase drainage.  Avoid ear plugs  Do not use Q-tips  After showers, help the water run out by tilting your head to one side.  GET HELP RIGHT AWAY IF:   Fever is over 102.2 degrees.  You develop progressive ear pain or hearing loss.  Ear symptoms persist longer than 3 days after treatment.  MAKE SURE YOU:   Understand these instructions.  Will watch your condition.  Will get help right away if you are not doing well or get worse.  TO PREVENT SWIMMER'S EAR:  Use a bathing cap or custom fitted swim molds to keep your ears dry.  Towel off after swimming to dry your ears.  Tilt your head or pull your earlobes to allow the water to escape your ear canal.  If there is still water in your ears, consider using a hairdryer on the lowest setting.  Thank you for choosing an e-visit. Your e-visit answers were reviewed by a board certified advanced clinical practitioner to complete your personal  care plan. Depending upon the condition, your plan could have included both over the counter or prescription medications. Please review your pharmacy choice. Be sure that the pharmacy you have chosen is open so that you can pick up your prescription now.  If there is a problem you may message your provider in [place] to have the prescription routed to another pharmacy. Your safety is important to [place]. If you have drug allergies check your prescription carefully.  For the next 24 hours, you can use MyChart to ask questions about today's visit, request a non-urgent call back, or ask for a work or school excuse from your e-visit provider. You will get an email in the next two days asking about your experience. I hope that your e-visit has been valuable and will speed your recovery.

## 2020-05-12 NOTE — Addendum Note (Signed)
Addended by: Chevis Pretty on: 05/12/2020 07:27 PM   Modules accepted: Orders

## 2020-05-13 ENCOUNTER — Ambulatory Visit (INDEPENDENT_AMBULATORY_CARE_PROVIDER_SITE_OTHER): Payer: 59 | Admitting: Neurology

## 2020-05-13 ENCOUNTER — Other Ambulatory Visit: Payer: Self-pay

## 2020-05-13 ENCOUNTER — Encounter: Payer: Self-pay | Admitting: Neurology

## 2020-05-13 VITALS — BP 102/69 | HR 87 | Ht 67.0 in | Wt 153.0 lb

## 2020-05-13 DIAGNOSIS — Q283 Other malformations of cerebral vessels: Secondary | ICD-10-CM | POA: Diagnosis not present

## 2020-05-13 DIAGNOSIS — G40109 Localization-related (focal) (partial) symptomatic epilepsy and epileptic syndromes with simple partial seizures, not intractable, without status epilepticus: Secondary | ICD-10-CM

## 2020-05-13 MED ORDER — LEVETIRACETAM ER 500 MG PO TB24: ORAL_TABLET | ORAL | 3 refills | Status: DC

## 2020-05-13 MED ORDER — LEVETIRACETAM ER 750 MG PO TB24: ORAL_TABLET | ORAL | 3 refills | Status: DC

## 2020-05-13 NOTE — Progress Notes (Signed)
NEUROLOGY FOLLOW UP OFFICE NOTE  Carmen Perez [id number] 1981/08/02  HISTORY OF PRESENT ILLNESS: I had the pleasure of seeing Carmen Perez in follow-up in the neurology clinic on 05/13/2020.  The patient was last seen 8 months ago for seizures. She continues to do well, seizure-free since January 2020 (at that time she had less medication due to pharmacy issues for a week). She has been doing well and denies any episodes of nausea with right-sided tingling, no confusion, staring/unresponsiveness, gaps in time, focal weakness, myoclonic jerks. No headaches, dizziness, vision changes, no falls. Every once in a while in the evenings before she takes her next dose, she would have the faintest of symptoms that do not progress. Mood and sleep are good. She is taking Levetiracetam ER 1750mg  daily (taking 2 tabs of 500mg  and 1 tab of 750mg  dose), she feels the same fatigue/tiredness but nothing that affects daily activities. She has not needed the prn lorazepam for rescue. She teaches at [employer].   History on Initial Assessment 03/07/2016: This is a pleasant 39 yo LH woman with no significant past medical history presenting after new onset seizure last 02/12/2016. She started feeling lightheaded and had a cramping pain in the right flank region. She reports having these burning/crampy pains with lightheadedness occurring around 2 times a year for the past 10 years. They would usually resolve after she would hydrate herself, so she went to get water, but became more lightheaded where she could barely walk. She sat down then lost all feeling on her right arm from the collar bone down to all her fingers. She had difficulty lifting her right arm. She denied any clonic activity at that time. Over the course of 45 minutes, symptoms would come in a cycle with right flank burning followed by numbness in her arm for a minute, then go away where she could again move her arm normally. She noticed she could not focus on  things. Due to worsening symptoms, EMS was called, and in the ambulance she had an episode where she reported her head was forcefully turned to the left side and she could not straighten it. Her right leg was jerking. She could talk and comprehend the entire time. While she was sitting in the ER for 2 hours, she had the recurrent cyclical burning sensation over the right flank followed by right arm numbness lasting 1 minute occurring every 8 minutes. She denied any confusion but on ER notes she reported a sense of detachment and disorientation while in the ambulance. She denies any olfactory/gustatory hallucinations, deja vu, rising epigastric sensation, previous episodes of gaps in time or staring/unresponsiveness. In the ER, CBC and CMP were unremarkable. She had an MRI brain with and without contrast which I personally reviewed, there was a 1.5 x 1.0 cm focus of T1 and T2 hypointensity and susceptibility artifact in the left cingulate gyrus with a small amount of T2 hyperintensity centrally within the lesion. No surrounding edema or mass effect, no abnormal enhancement. Hippocampi symmetric with no abnormal signal or enhancement. Lesion in the cingulate gyrus was favored to represent a cavernoma. No evidence of recent hemorrhage. Her wake and drowsy EEG was normal. She was discharged home on Keppra 500mg  BID and denies any further similar symptoms since 02/12/16.   Epilepsy Risk Factors:  Left cingulate gyrus lesion suggestive of cavernoma. Otherwise she had a normal birth and early development.  There is no history of febrile convulsions, CNS infections such as meningitis/encephalitis, significant traumatic brain injury,  neurosurgical procedures, or family history of seizures  I personally reviewed repeat MRI brain with and without contrast done 02/07/17 which showed stable left posterior cingulate gyrus 1.5cm lesion with signal characteristics most consistent with cavernoma.   PAST MEDICAL HISTORY: Past  Medical History:  Diagnosis Date  . Cavernoma   . HSV (herpes simplex virus) anogenital infection   . Post partum thyroiditis   . Seizures ([place])     MEDICATIONS: Current Outpatient Medications on File Prior to Visit  Medication Sig Dispense Refill  . ibuprofen (ADVIL,MOTRIN) 200 MG tablet Take 200 mg by mouth every 6 (six) hours as needed for headache.    . levETIRAcetam (KEPPRA XR) 500 MG 24 hr tablet TAKE 2 TABLETS BY MOUTH AT NIGHT IN ADDITION TO LEVETIRACETAM 750MG  TABLET FOR TOTAL OF 1750MG  DAILY 180 tablet 3  . Levetiracetam 750 MG TB24 Take 1 tablet at night (take with Keppra XR 500mg  2 tablets for a total of 1750mg  daily) 90 tablet 3  . LORazepam (ATIVAN) 0.5 MG tablet Take as needed for seizure 10 tablet 5  . Multiple Vitamins-Minerals (HM MULTIVITAMIN ADULT GUMMY PO) Take 1 each by mouth daily.    Marland Kitchen neomycin-polymyxin-hydrocortisone (CORTISPORIN) OTIC solution Place 3 drops into both ears 4 (four) times daily. 10 mL 0   No current facility-administered medications on file prior to visit.    ALLERGIES: Allergies  Allergen Reactions  . Lamictal [Lamotrigine] Rash  . Meat Extract Hives and Other (See Comments)    Lamb - Red meat    FAMILY HISTORY: Family History  Problem Relation Age of Onset  . Hypertension Mother   . Hypothyroidism Mother   . Hypertension Father   . Transient ischemic attack Father   . Other Neg Hx     SOCIAL HISTORY: Social History   Socioeconomic History  . Marital status: Married    Spouse name: Not on file  . Number of children: 1  . Years of education: Not on file  . Highest education level: Not on file  Occupational History  . Not on file  Tobacco Use  . Smoking status: Never Smoker  . Smokeless tobacco: Never Used  Substance and Sexual Activity  . Alcohol use: No  . Drug use: No  . Sexual activity: Yes    Partners: Male    Birth control/protection: I.U.D.  Other Topics Concern  . Not on file  Social History Narrative    Left handed      Phd      Lives with husband and daughter   Social Determinants of Health   Financial Resource Strain:   . Difficulty of Paying Living Expenses:   Food Insecurity:   . Worried About Charity fundraiser in the Last Year:   . Arboriculturist in the Last Year:   Transportation Needs:   . Film/video editor (Medical):   Marland Kitchen Lack of Transportation (Non-Medical):   Physical Activity:   . Days of Exercise per Week:   . Minutes of Exercise per Session:   Stress:   . Feeling of Stress :   Social Connections:   . Frequency of Communication with Friends and Family:   . Frequency of Social Gatherings with Friends and Family:   . Attends Religious Services:   . Active Member of Clubs or Organizations:   . Attends Archivist Meetings:   Marland Kitchen Marital Status:   Intimate Partner Violence:   . Fear of Current or Ex-Partner:   . Emotionally  Abused:   Marland Kitchen Physically Abused:   . Sexually Abused:    PHYSICAL EXAM: Vitals:   05/13/20 0821  BP: 102/69  Pulse: 87  SpO2: 98%   General: No acute distress Head:  Normocephalic/atraumatic Skin/Extremities: No rash, no edema Neurological Exam: alert and oriented to person, place, and time. No aphasia or dysarthria. Fund of knowledge is appropriate.  Recent and remote memory are intact.  Attention and concentration are normal.   Cranial nerves: Pupils equal, round, reactive to light. Extraocular movements intact with no nystagmus. Visual fields full. No facial asymmetry. Motor: Bulk and tone normal, muscle strength 5/5 throughout with no pronator drift.  Deep tendon reflexes +1 throughout. Finger to nose testing intact.  Gait narrow-based and steady, able to tandem walk adequately.   IMPRESSION: This is a pleasant 39 yo LH woman who presented with recurrent stereotyped episodes where she describes a burning sensation in her right flank, followed by right arm numbness and weakness. She was having multiple episodes on 02/12/16, at  one point she had right leg clonic activity and head deviation to the left. MRI brain showed a left cingulate gyrus lesion suggestive of a cavernoma. Routine EEG normal. Symptoms suggestive of focal seizures arising from left-sided lesion. She continues to do well with no further clonic activity since March 2017, no sensory seizures since January 2020. Refills sent for Levetiracetam ER 1750mg  daily. She has prn lorazepam for seizure rescue. She will contact Dr. Cleotilde Neer office to inquire about MRI brain follow-up frequency. She is aware of [place] driving laws to stop driving after a seizure until 6 months seizure-free. Follow-up in 1 year, she knows to call for any changes.   Thank you for allowing me to participate in her care.  Please do not hesitate to call for any questions or concerns.  Ellouise Newer, M.D.

## 2020-05-13 NOTE — Patient Instructions (Addendum)
Great to see you! Continue Keppra XR 1750mg  daily. Pls check in with Dr. Kathyrn Sheriff regarding MRI frequency. Follow-up in 1 year, call for any changes.  Seizure Precautions: 1. If medication has been prescribed for you to prevent seizures, take it exactly as directed.  Do not stop taking the medicine without talking to your doctor first, even if you have not had a seizure in a long time.   2. Avoid activities in which a seizure would cause danger to yourself or to others.  Don't operate dangerous machinery, swim alone, or climb in high or dangerous places, such as on ladders, roofs, or girders.  Do not drive unless your doctor says you may.  3. If you have any warning that you may have a seizure, lay down in a safe place where you can't hurt yourself.    4.  No driving for 6 months from last seizure, as per [facility].   Please refer to the following link on the [employer] website for more information: http://www.epilepsyfoundation.org/answerplace/Social/driving/drivingu.cfm   5.  Maintain good sleep hygiene. Avoid alcohol.  6.  Notify your neurology if you are planning pregnancy or if you become pregnant.  7.  Contact your doctor if you have any problems that may be related to the medicine you are taking.  8.  Call 911 and bring the patient back to the ED if:        A.  The seizure lasts longer than 5 minutes.       B.  The patient doesn't awaken shortly after the seizure  C.  The patient has new problems such as difficulty seeing, speaking or moving  D.  The patient was injured during the seizure  E.  The patient has a temperature over 102 F (39C)  F.  The patient vomited and now is having trouble breathing

## 2020-10-16 IMAGING — CR CHEST - 2 VIEW
2 series · 2 of 2 positions shown · non-contrast
Comparison: None.

CLINICAL DATA: Chest pressure

EXAM:
CHEST - 2 VIEW

[chest pa]
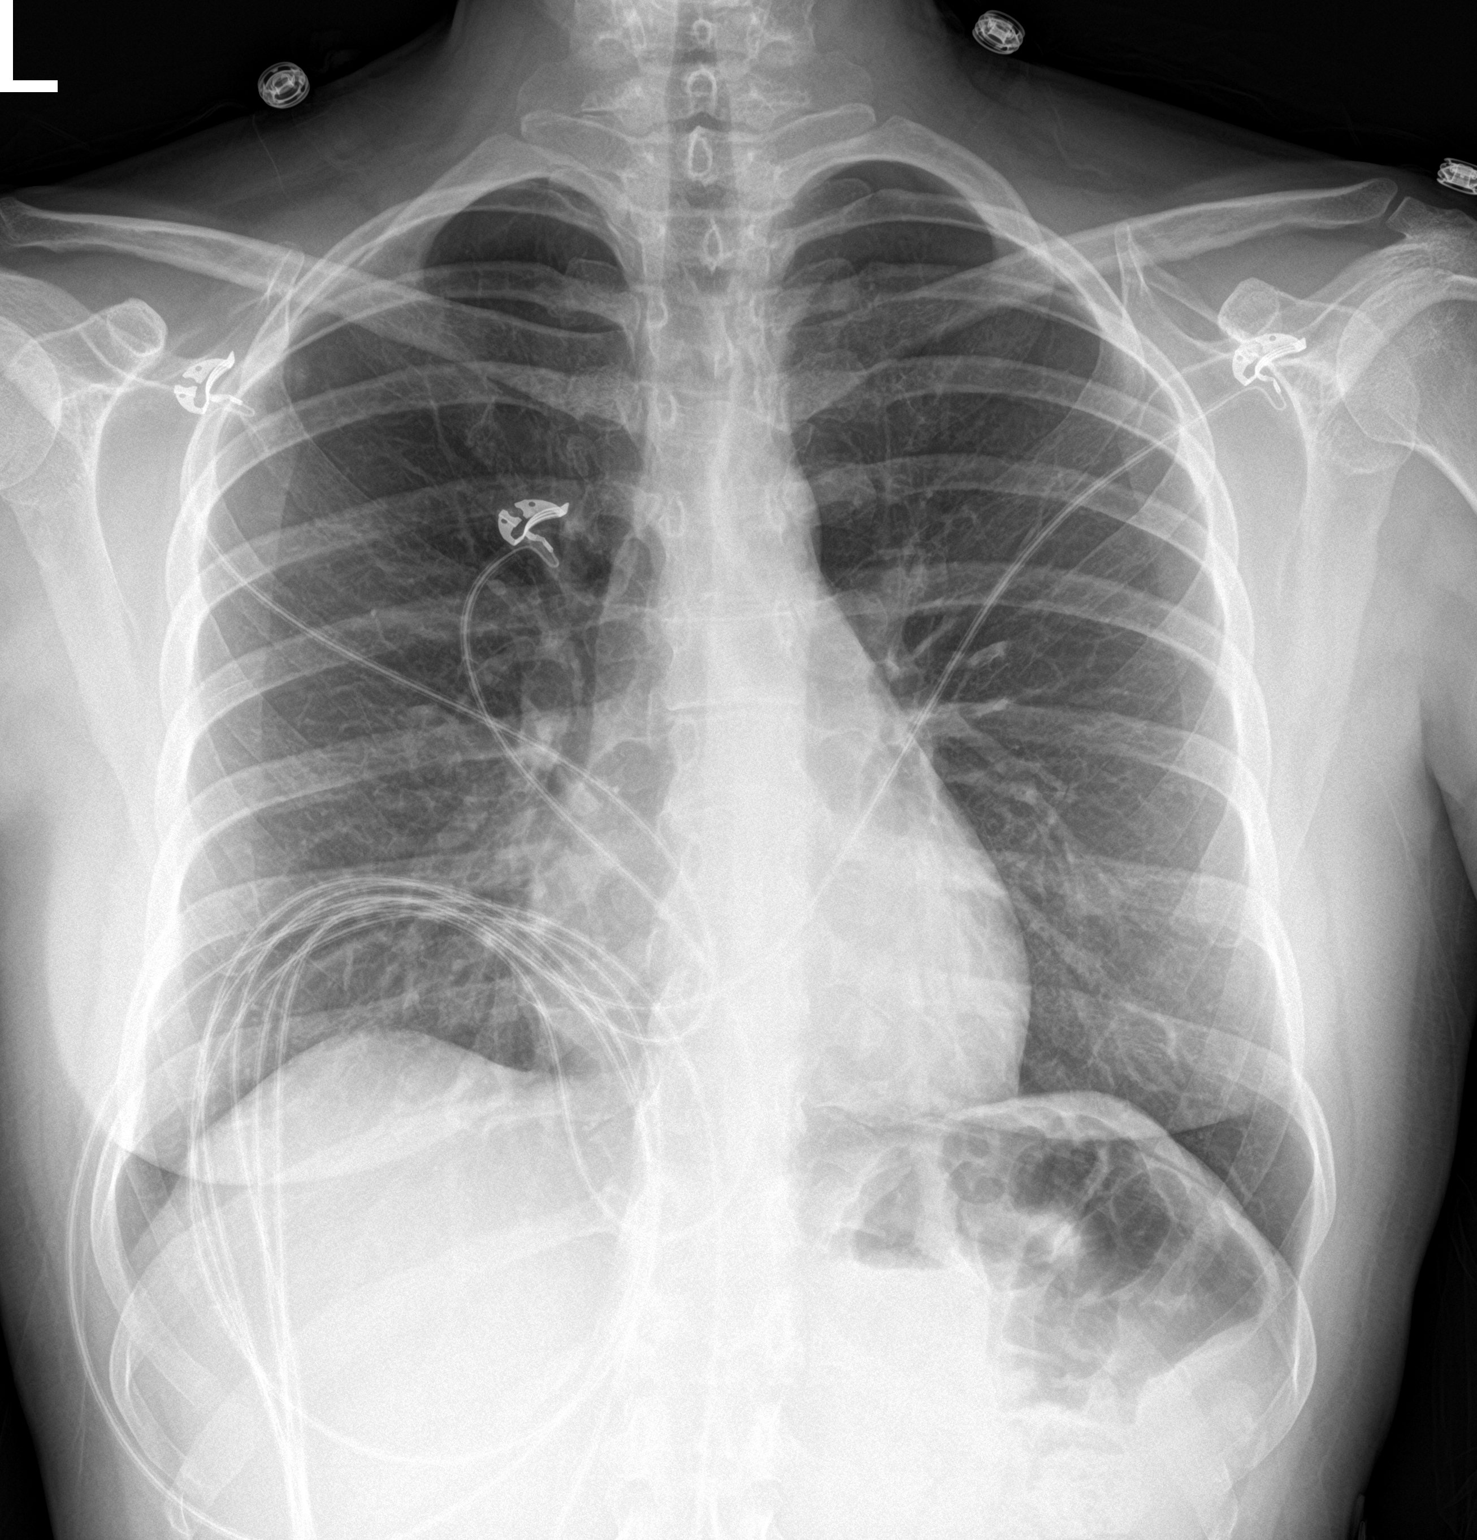

[chest lat]
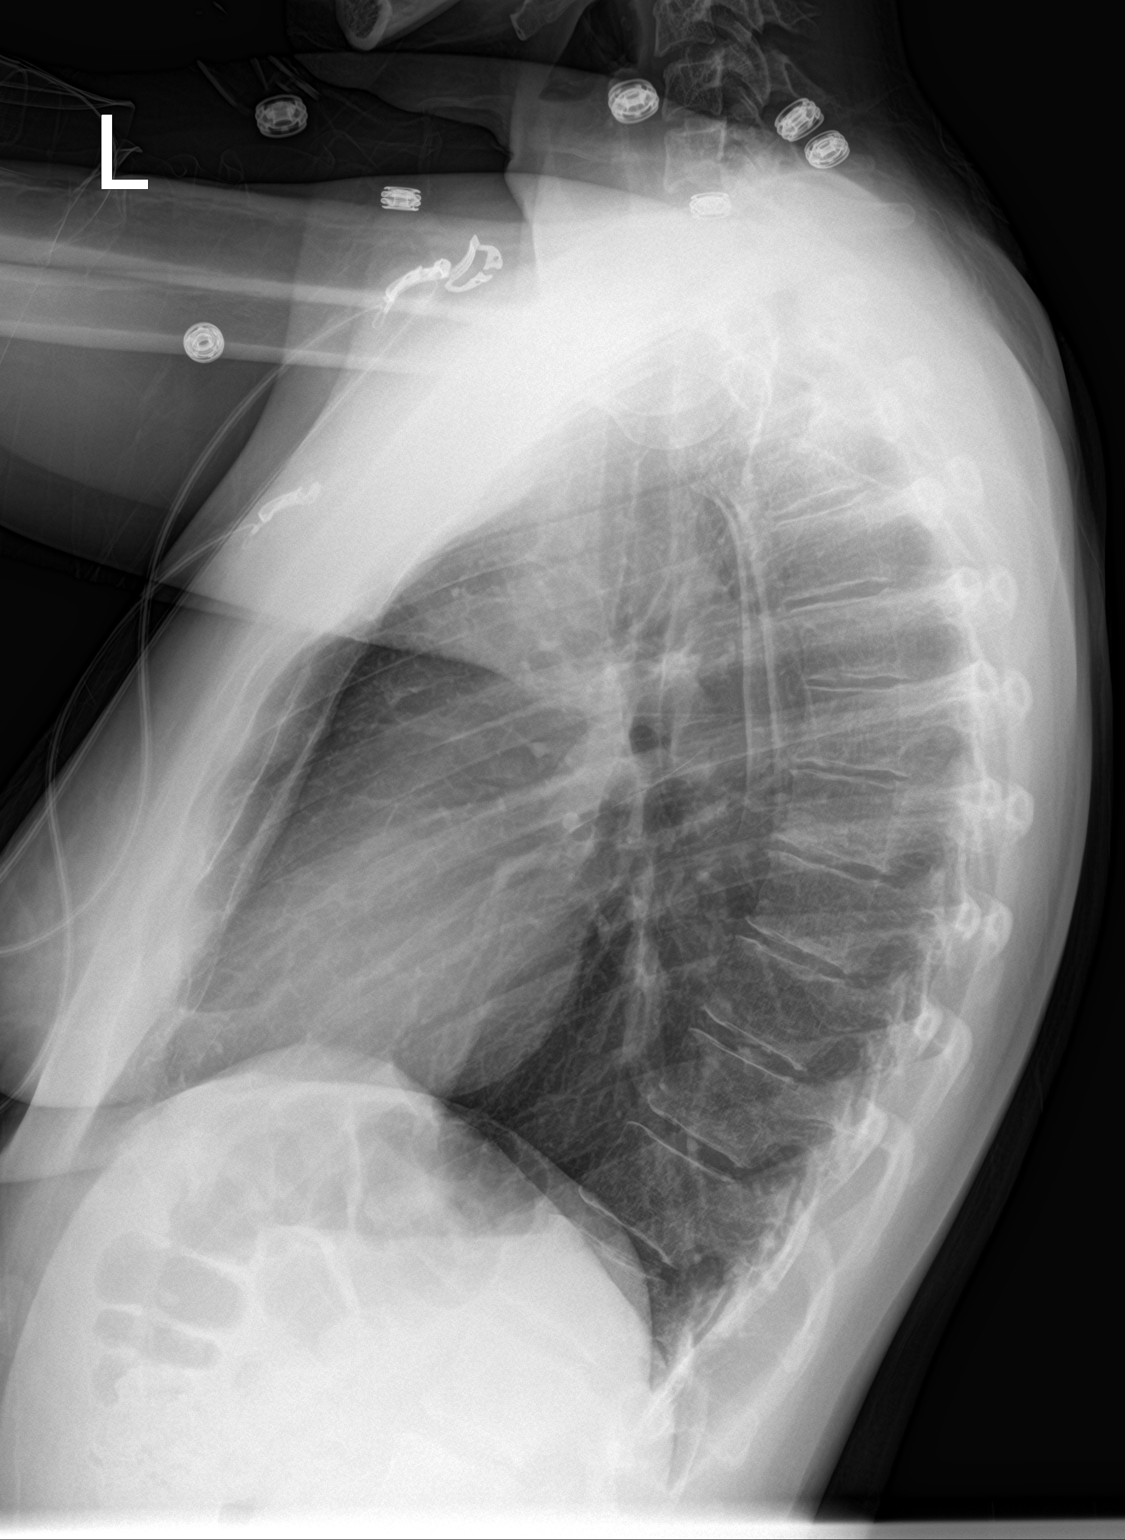

[2 of 2 positions shown; findings below may reference images not displayed]

FINDINGS: The lungs are clear without focal pneumonia, edema, pneumothorax or
pleural effusion. The cardiopericardial silhouette is within normal
limits for size. The visualized bony structures of the thorax are
intact. Telemetry leads overlie the chest.
IMPRESSION: No active cardiopulmonary disease.

## 2020-11-18 DIAGNOSIS — Z8639 Personal history of other endocrine, nutritional and metabolic disease: Secondary | ICD-10-CM | POA: Insufficient documentation

## 2021-05-13 ENCOUNTER — Other Ambulatory Visit: Payer: Self-pay

## 2021-05-13 ENCOUNTER — Telehealth: Payer: Self-pay | Admitting: Neurology

## 2021-05-13 ENCOUNTER — Telehealth (INDEPENDENT_AMBULATORY_CARE_PROVIDER_SITE_OTHER): Payer: 59 | Admitting: Neurology

## 2021-05-13 ENCOUNTER — Encounter: Payer: Self-pay | Admitting: Neurology

## 2021-05-13 DIAGNOSIS — Q283 Other malformations of cerebral vessels: Secondary | ICD-10-CM

## 2021-05-13 DIAGNOSIS — G40109 Localization-related (focal) (partial) symptomatic epilepsy and epileptic syndromes with simple partial seizures, not intractable, without status epilepticus: Secondary | ICD-10-CM

## 2021-05-13 MED ORDER — LEVETIRACETAM ER 500 MG PO TB24: ORAL_TABLET | ORAL | 3 refills | Status: DC

## 2021-05-13 MED ORDER — LORAZEPAM 0.5 MG PO TABS: ORAL_TABLET | ORAL | 5 refills | Status: DC

## 2021-05-13 MED ORDER — LEVETIRACETAM ER 750 MG PO TB24: ORAL_TABLET | ORAL | 3 refills | Status: DC

## 2021-05-13 NOTE — Progress Notes (Signed)
Telephone (Audio) Visit The purpose of this telephone visit is to provide medical care while limiting exposure to the novel coronavirus.    Consent was obtained for telephone visit:  Yes.   Answered questions that patient had about telehealth interaction:  Yes.   I discussed the limitations, risks, security and privacy concerns of performing an evaluation and management service by telephone. I also discussed with the patient that there may be a patient responsible charge related to this service. The patient expressed understanding and agreed to proceed.  Pt location: Home Physician Location: office Name of referring provider:  No ref. provider found I connected with .Carmen Perez at patients initiation/request on 05/13/2021 at  8:30 AM EDT by telephone and verified that I am speaking with the correct person using two identifiers.  Pt MRN:  [id number] Pt DOB:  October 12, 1981   History of Present Illness:  The patient had a telephone visit on 05/13/2021. Unable to connect via video due to technical difficulties. She is currently having COVID symptoms. Since her last visit to the neurology clinic a year ago, she has overall been doing very well. She has been seizure-free since January 2020 (at that time she had less medication due to pharmacy issues for a week). She has only been having a couple of instances of lightheadedness and headaches in the past 2 days associated with COVID, otherwise no issues in the past year. She denies any staring/unresponsive episodes, gaps in time, olfactory/gustatory hallucinations, focal numbness/tingling/weakness, myoclonic jerks. No falls. She has not needed lorazepam in the past year. She continues on Levetiracetam ER 1750mg  daily (taking 2 tabs of 500mg  and 1 tab of 750mg ) without significant side effects. She still feels a little tiredness but nothing new. Mood is unchanged. Sleep is okay.    History on Initial Assessment 03/07/2016: This is a pleasant 40 yo LH  woman with no significant past medical history presenting after new onset seizure last 02/12/2016. She started feeling lightheaded and had a cramping pain in the right flank region. She reports having these burning/crampy pains with lightheadedness occurring around 2 times a year for the past 10 years. They would usually resolve after she would hydrate herself, so she went to get water, but became more lightheaded where she could barely walk. She sat down then lost all feeling on her right arm from the collar bone down to all her fingers. She had difficulty lifting her right arm. She denied any clonic activity at that time. Over the course of 45 minutes, symptoms would come in a cycle with right flank burning followed by numbness in her arm for a minute, then go away where she could again move her arm normally. She noticed she could not focus on things. Due to worsening symptoms, EMS was called, and in the ambulance she had an episode where she reported her head was forcefully turned to the left side and she could not straighten it. Her right leg was jerking. She could talk and comprehend the entire time. While she was sitting in the ER for 2 hours, she had the recurrent cyclical burning sensation over the right flank followed by right arm numbness lasting 1 minute occurring every 8 minutes. She denied any confusion but on ER notes she reported a sense of detachment and disorientation while in the ambulance. She denies any olfactory/gustatory hallucinations, deja vu, rising epigastric sensation, previous episodes of gaps in time or staring/unresponsiveness. In the ER, CBC and CMP were unremarkable. She had an  MRI brain with and without contrast which I personally reviewed, there was a 1.5 x 1.0 cm focus of T1 and T2 hypointensity and susceptibility artifact in the left cingulate gyrus with a small amount of T2 hyperintensity centrally within the lesion. No surrounding edema or mass effect, no abnormal enhancement.  Hippocampi symmetric with no abnormal signal or enhancement. Lesion in the cingulate gyrus was favored to represent a cavernoma. No evidence of recent hemorrhage. Her wake and drowsy EEG was normal. She was discharged home on Keppra 500mg  BID and denies any further similar symptoms since 02/12/16.   Epilepsy Risk Factors:  Left cingulate gyrus lesion suggestive of cavernoma. Otherwise she had a normal birth and early development.  There is no history of febrile convulsions, CNS infections such as meningitis/encephalitis, significant traumatic brain injury, neurosurgical procedures, or family history of seizures  I personally reviewed repeat MRI brain with and without contrast done 02/07/17 which showed stable left posterior cingulate gyrus 1.5cm lesion with signal characteristics most consistent with cavernoma.    Current Outpatient Medications on File Prior to Visit  Medication Sig Dispense Refill   ibuprofen (ADVIL,MOTRIN) 200 MG tablet Take 200 mg by mouth every 6 (six) hours as needed for headache.     levETIRAcetam (KEPPRA XR) 500 MG 24 hr tablet TAKE 2 TABLETS BY MOUTH AT NIGHT IN ADDITION TO LEVETIRACETAM 750MG  TABLET FOR TOTAL OF 1750MG  DAILY 180 tablet 3   Levetiracetam 750 MG TB24 Take 1 tablet at night (take with Keppra XR 500mg  2 tablets for a total of 1750mg  daily) 90 tablet 3   LORazepam (ATIVAN) 0.5 MG tablet Take as needed for seizure 10 tablet 5   Multiple Vitamins-Minerals (HM MULTIVITAMIN ADULT GUMMY PO) Take 1 each by mouth daily.     neomycin-polymyxin-hydrocortisone (CORTISPORIN) OTIC solution Place 3 drops into both ears 4 (four) times daily. 10 mL 0   No current facility-administered medications on file prior to visit.      Observations/Objective:   Exam limited due to nature of telephone visit. Patient is awake, alert, able to answer questions without confusion or dysarthria.   Assessment and Plan:   This is a pleasant 40 yo LH woman who presented with recurrent  stereotyped episodes where she describes a burning sensation in her right flank, followed by right arm numbness and weakness. She was having multiple episodes on 02/12/16, at one point she had right leg clonic activity and head deviation to the left. MRI brain showed a left cingulate gyrus lesion suggestive of a cavernoma. Routine EEG normal. Symptoms suggestive of focal seizures arising from left-sided lesion. She has done well iwith no further clonic activity since March 2017, no sensory seizures since January 2020. She is having some lightheadedness and headaches with current COVID infection, if symptoms worsen and appear more similar to her seizures, may take prn lorazepam. Continue Levetiracetam ER 1750mg  daily. She will contact Dr. Cleotilde Neer office to inquire about MRI brain follow-up frequency. She is aware of [place] driving laws to stop driving after a seizure until 6 months seizure-free. Follow-up in 1 year, she knows to call for any changes.    Follow Up Instructions:   -I discussed the assessment and treatment plan with the patient. The patient was provided an opportunity to ask questions and all were answered. The patient agreed with the plan and demonstrated an understanding of the instructions.   The patient was advised to call back or seek an in-person evaluation if the symptoms worsen or if the condition  fails to improve as anticipated.    Total Time spent in visit with the patient was:  6:51, of which 100% of the time was spent in counseling and/or coordinating care on the above.   Pt understands and agrees with the plan of care outlined.     Cameron Sprang, MD

## 2021-05-13 NOTE — Telephone Encounter (Signed)
Please advise 

## 2021-05-13 NOTE — Patient Instructions (Signed)
Great talking with you today. Continue all your medications, refills have been sent. Hope you feel better soon. Contact Dr. Kathyrn Sheriff to inquire about repeat MRI. Follow-up in 1 year, call for any changes.    Seizure Precautions: 1. If medication has been prescribed for you to prevent seizures, take it exactly as directed.  Do not stop taking the medicine without talking to your doctor first, even if you have not had a seizure in a long time.   2. Avoid activities in which a seizure would cause danger to yourself or to others.  Don't operate dangerous machinery, swim alone, or climb in high or dangerous places, such as on ladders, roofs, or girders.  Do not drive unless your doctor says you may.  3. If you have any warning that you may have a seizure, lay down in a safe place where you can't hurt yourself.    4.  No driving for 6 months from last seizure, as per [facility].   Please refer to the following link on the [place] website for more information: http://www.epilepsyfoundation.org/answerplace/Social/driving/drivingu.cfm   5.  Maintain good sleep hygiene. Avoid alcohol.  6.  Notify your neurology if you are planning pregnancy or if you become pregnant.  7.  Contact your doctor if you have any problems that may be related to the medicine you are taking.  8.  Call 911 and bring the patient back to the ED if:        A.  The seizure lasts longer than 5 minutes.       B.  The patient doesn't awaken shortly after the seizure  C.  The patient has new problems such as difficulty seeing, speaking or moving  D.  The patient was injured during the seizure  E.  The patient has a temperature over 102 F (39C)  F.  The patient vomited and now is having trouble breathing

## 2021-05-14 NOTE — Telephone Encounter (Signed)
She will need to speak to her PCP if she is a candidate for antiviral based on her symptoms, thanks

## 2021-05-14 NOTE — Telephone Encounter (Signed)
Pls let her know that PCPs have checklists/criteria for the antiviral, I don't have their criteria list but don't believe seizures are part of the immunocompromised state, but there are also other parts of criteria like BMI, other symptoms that PCP office checks. Thanks

## 2021-05-14 NOTE — Telephone Encounter (Signed)
Called patient and informed her to reach out to her PCP.   Patient stated that she was actually asking if Dr. Delice Lesch thought she fell into the high risk category for the antiviral medication based on her neurological state? Patient stated that the [facility] website was very vague with the information so she wanted to reach out to Dr. Delice Lesch to see her thoughts.

## 2021-05-14 NOTE — Telephone Encounter (Signed)
Called patient and left a message for a call back.  

## 2021-05-17 NOTE — Telephone Encounter (Signed)
Pt called no answer left a voice mail to call the office back, will send her a my chart message

## 2022-02-01 ENCOUNTER — Other Ambulatory Visit: Payer: Self-pay | Admitting: Obstetrics & Gynecology

## 2022-02-01 DIAGNOSIS — R928 Other abnormal and inconclusive findings on diagnostic imaging of breast: Secondary | ICD-10-CM

## 2022-02-15 ENCOUNTER — Ambulatory Visit: Payer: 59

## 2022-02-15 ENCOUNTER — Ambulatory Visit
Admission: RE | Admit: 2022-02-15 | Discharge: 2022-02-15 | Disposition: A | Discharge status: Home / Self Care | Payer: 59 | Source: Ambulatory Visit | Attending: Obstetrics & Gynecology | Admitting: Obstetrics & Gynecology

## 2022-02-15 DIAGNOSIS — R928 Other abnormal and inconclusive findings on diagnostic imaging of breast: Secondary | ICD-10-CM

## 2022-05-12 ENCOUNTER — Encounter: Payer: Self-pay | Admitting: Neurology

## 2022-05-12 ENCOUNTER — Ambulatory Visit (INDEPENDENT_AMBULATORY_CARE_PROVIDER_SITE_OTHER): Payer: 59 | Admitting: Neurology

## 2022-05-12 DIAGNOSIS — G40109 Localization-related (focal) (partial) symptomatic epilepsy and epileptic syndromes with simple partial seizures, not intractable, without status epilepticus: Secondary | ICD-10-CM | POA: Diagnosis not present

## 2022-05-12 MED ORDER — LEVETIRACETAM ER 500 MG PO TB24: ORAL_TABLET | ORAL | 3 refills | Status: DC

## 2022-05-12 MED ORDER — LEVETIRACETAM ER 750 MG PO TB24: ORAL_TABLET | ORAL | 3 refills | Status: DC

## 2022-05-12 NOTE — Patient Instructions (Signed)
Always good to see you. Continue Levetiracetam (Keppra) ER '1750mg'$  daily. Refills sent. We will contact Dr. Cleotilde Neer office and let you know if we need to do anything further. Follow-up in 1 year, call for any changes.    Seizure Precautions: 1. If medication has been prescribed for you to prevent seizures, take it exactly as directed.  Do not stop taking the medicine without talking to your doctor first, even if you have not had a seizure in a long time.   2. Avoid activities in which a seizure would cause danger to yourself or to others.  Don't operate dangerous machinery, swim alone, or climb in high or dangerous places, such as on ladders, roofs, or girders.  Do not drive unless your doctor says you may.  3. If you have any warning that you may have a seizure, lay down in a safe place where you can't hurt yourself.    4.  No driving for 6 months from last seizure, as per [facility].   Please refer to the following link on the [place] website for more information: http://www.epilepsyfoundation.org/answerplace/Social/driving/drivingu.cfm   5.  Maintain good sleep hygiene. Avoid alcohol.  6.  Notify your neurology if you are planning pregnancy or if you become pregnant.  7.  Contact your doctor if you have any problems that may be related to the medicine you are taking.  8.  Call 911 and bring the patient back to the ED if:        A.  The seizure lasts longer than 5 minutes.       B.  The patient doesn't awaken shortly after the seizure  C.  The patient has new problems such as difficulty seeing, speaking or moving  D.  The patient was injured during the seizure  E.  The patient has a temperature over 102 F (39C)  F.  The patient vomited and now is having trouble breathing

## 2022-05-12 NOTE — Progress Notes (Signed)
NEUROLOGY FOLLOW UP OFFICE NOTE  Carmen Perez [id number] [birth year]  HISTORY OF PRESENT ILLNESS: I had the pleasure of seeing Carmen Perez in follow-up in the neurology clinic on 05/12/2022.  The patient was last seen a year ago for seizures. She continues to do well seizure-free since January 2020 (at that time she had less medication due to pharmacy issues for a week). She denies any staring/unresponsive episodes, myoclonic jerks. Sometimes before she goes to bed/take night medication, she may feel a little tingling or tiny lightheadedness, but nothing that progresses. She denies any headaches, vision changes, no falls. She is always tired, she stays in bed for 9 hours at night. Mood is good. She is on Levetiracetam ER '1750mg'$  daily (taking 2 tabs of '500mg'$  and 1 tab of '750mg'$ ) without significant side effects. 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 She may have had a recent brain MRI through Dr. Cleotilde Neer office, records unavailable for review.   History on Initial Assessment 03/07/2016: This is a pleasant 41 yo LH woman with no significant past medical history presenting after new onset seizure last 02/12/2016. She started feeling lightheaded and had a cramping pain in the right flank region. She reports having these burning/crampy pains with lightheadedness occurring around 2 times a year for the past 10 years. They would usually resolve after she would hydrate herself, so she went to get water, but became more lightheaded where she could barely walk. She sat down then lost all feeling on her right arm from the collar bone down to all her fingers. She had difficulty lifting her right arm. She denied any clonic activity at that time. Over the course of 45 minutes, symptoms would come in a cycle with right flank burning followed by numbness in her arm for a minute, then go away where she could again move her arm normally. She noticed  she could not focus on things. Due to worsening symptoms, EMS was called, and in the ambulance she had an episode where she reported her head was forcefully turned to the left side and she could not straighten it. Her right leg was jerking. She could talk and comprehend the entire time. While she was sitting in the ER for 2 hours, she had the recurrent cyclical burning sensation over the right flank followed by right arm numbness lasting 1 minute occurring every 8 minutes. She denied any confusion but on ER notes she reported a sense of detachment and disorientation while in the ambulance. She denies any olfactory/gustatory hallucinations, deja vu, rising epigastric sensation, previous episodes of gaps in time or staring/unresponsiveness. In the ER, CBC and CMP were unremarkable. She had an MRI brain with and without contrast which I personally reviewed, there was a 1.5 x 1.0 cm focus of T1 and T2 hypointensity and susceptibility artifact in the left cingulate gyrus with a small amount of T2 hyperintensity centrally within the lesion. No surrounding edema or mass effect, no abnormal enhancement. Hippocampi symmetric with no abnormal signal or enhancement. Lesion in the cingulate gyrus was favored to represent a cavernoma. No evidence of recent hemorrhage. Her wake and drowsy EEG was normal. She was discharged home on Keppra '500mg'$  BID and denies any further similar symptoms since 02/12/16.   Epilepsy Risk Factors:  Left cingulate gyrus lesion suggestive of cavernoma. Otherwise she had a normal birth and early development.  There is no history of febrile convulsions, CNS infections such as meningitis/encephalitis, significant  traumatic brain injury, neurosurgical procedures, or family history of seizures  I personally reviewed repeat MRI brain with and without contrast done 02/07/17 which showed stable left posterior cingulate gyrus 1.5cm lesion with signal characteristics most consistent with cavernoma.     PAST  MEDICAL HISTORY: Past Medical History:  Diagnosis Date   Cavernoma    HSV (herpes simplex virus) anogenital infection    Post partum thyroiditis    Seizures ([place])     MEDICATIONS: Current Outpatient Medications on File Prior to Visit  Medication Sig Dispense Refill   ibuprofen (ADVIL,MOTRIN) 200 MG tablet Take 200 mg by mouth every 6 (six) hours as needed for headache.     levETIRAcetam (KEPPRA XR) 500 MG 24 hr tablet TAKE 2 TABLETS BY MOUTH AT NIGHT IN ADDITION TO LEVETIRACETAM '750MG'$  TABLET FOR TOTAL OF '1750MG'$  DAILY 180 tablet 3   Levetiracetam 750 MG TB24 Take 1 tablet at night (take with Keppra XR '500mg'$  2 tablets for a total of '1750mg'$  daily) 90 tablet 3   LORazepam (ATIVAN) 0.5 MG tablet Take as needed for seizure 10 tablet 5   Multiple Vitamins-Minerals (HM MULTIVITAMIN ADULT GUMMY PO) Take 1 each by mouth daily.     No current facility-administered medications on file prior to visit.    ALLERGIES: Allergies  Allergen Reactions   Lamictal [Lamotrigine] Rash   Meat Extract Hives and Other (See Comments)    Lamb - Red meat    FAMILY HISTORY: Family History  Problem Relation Age of Onset   Hypertension Mother    Hypothyroidism Mother    Hypertension Father    Transient ischemic attack Father    Other Neg Hx    Breast cancer Neg Hx     SOCIAL HISTORY: Social History   Socioeconomic History   Marital status: Married    Spouse name: Not on file   Number of children: 1   Years of education: Not on file   Highest education level: Not on file  Occupational History   Not on file  Tobacco Use   Smoking status: Never   Smokeless tobacco: Never  Vaping Use   Vaping Use: Never used  Substance and Sexual Activity   Alcohol use: No   Drug use: No   Sexual activity: Yes    Partners: Male    Birth control/protection: I.U.D.  Other Topics Concern   Not on file  Social History Narrative   Left handed      Phd      Lives with husband and daughter   Social  Determinants of Health   Financial Resource Strain: Not on file  Food Insecurity: Not on file  Transportation Needs: Not on file  Physical Activity: Not on file  Stress: Not on file  Social Connections: Not on file  Intimate Partner Violence: Not on file     PHYSICAL EXAM: Vitals:   05/12/22 0820  BP: 109/78  Pulse: 73  SpO2: 99%   General: No acute distress Head:  Normocephalic/atraumatic Skin/Extremities: No rash, no edema Neurological Exam: alert and awake. No aphasia or dysarthria. Fund of knowledge is appropriate. Attention and concentration are normal.   Cranial nerves: Pupils equal, round. Extraocular movements intact with no nystagmus. Visual fields full.  No facial asymmetry.  Motor: Bulk and tone normal, muscle strength 5/5 throughout with no pronator drift.   Finger to nose testing intact.  Gait narrow-based and steady, able to tandem walk adequately.  Romberg negative.   IMPRESSION: This is a pleasant 41  yo LH woman who presented with recurrent stereotyped episodes where she describes a burning sensation in her right flank, followed by right arm numbness and weakness. She was having multiple episodes on 02/12/16, at one point she had right leg clonic activity and head deviation to the left. MRI brain showed a left cingulate gyrus lesion suggestive of a cavernoma. Routine EEG normal. Symptoms suggestive of focal seizures arising from left-sided lesion. She continues to do well with no further clonic activity since March 2017, no sensory seizures since January 2020. Continue Levetiracetam ER '1750mg'$  daily, refills sent. Records from Dr. Cleotilde Neer office will be requested for review, if none since 2018, we can order the study for interval follow-up of cavernoma. She is aware of [place] driving laws to stop driving after a seizure until 6 months seizure-free. Follow-up in 1 year, call for any changes.   Thank you for allowing me to participate in her care.  Please do not hesitate to  call for any questions or concerns.    Ellouise Newer, M.D.   CC: Dr. Kathyrn Sheriff

## 2022-06-28 ENCOUNTER — Ambulatory Visit (INDEPENDENT_AMBULATORY_CARE_PROVIDER_SITE_OTHER): Payer: 59 | Admitting: Allergy & Immunology

## 2022-06-28 ENCOUNTER — Encounter: Payer: Self-pay | Admitting: Allergy & Immunology

## 2022-06-28 VITALS — BP 114/72 | HR 65 | Temp 98.2°F | Resp 20 | Ht 67.72 in | Wt 146.6 lb

## 2022-06-28 DIAGNOSIS — T7800XD Anaphylactic reaction due to unspecified food, subsequent encounter: Secondary | ICD-10-CM

## 2022-06-28 DIAGNOSIS — J302 Other seasonal allergic rhinitis: Secondary | ICD-10-CM | POA: Diagnosis not present

## 2022-06-28 DIAGNOSIS — Q283 Other malformations of cerebral vessels: Secondary | ICD-10-CM | POA: Insufficient documentation

## 2022-06-28 MED ORDER — EPINEPHRINE 0.3 MG/0.3ML IJ SOAJ: 0.3000 mg | INTRAMUSCULAR | 2 refills | Status: AC | PRN

## 2022-06-28 NOTE — Patient Instructions (Addendum)
1. Anaphylactic shock due to food - We are going to try to do alpha gal testing today. - Anaphylaxis management plant provided. - EpiPen training reviewed.  - We will call you in 1-2 weeks with the results of the testing.   2. Seasonal allergic rhinitis - We did not do testing since your symptoms are not that severe. - we could reconsider that in the future.   3. Return in about 1 year (around 06/29/2023).    Please inform us of any Emergency Department visits, hospitalizations, or changes in symptoms. Call us before going to the ED for breathing or allergy symptoms since we might be able to fit you in for a sick visit. Feel free to contact us anytime with any questions, problems, or concerns.  It was a pleasure to meet you and your family today! 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   Websites that have reliable patient information: 1. American Academy of Asthma, Allergy, and Immunology: www.aaaai.org 2. Food Allergy Research and Education (FARE): foodallergy.org 3. Mothers of Asthmatics: http://www.asthmacommunitynetwork.org 4. American College of Allergy, Asthma, and Immunology: www.acaai.org   COVID-19 Vaccine Information can be found at: ShippingScam.co.uk For questions related to vaccine distribution or appointments, please email [email or web address] or call [phone].   We realize that you might be concerned about having an allergic reaction to the COVID19 vaccines. To help with that concern, WE ARE OFFERING THE COVID19 VACCINES IN OUR OFFICE! Ask the front desk for dates!     "Like" [place] on Facebook and Instagram for our latest updates!      A healthy democracy works best when [employer] participate! Make sure you are registered to vote! If you have moved or changed any of your contact information, you will need to get this updated before voting!  In some cases, you MAY be able to register to vote  online: [email or web address]

## 2022-06-28 NOTE — Progress Notes (Signed)
NEW PATIENT  Date of Service/Encounter:  06/28/22  Consult requested by: Patient, No Pcp Per   Assessment:   Anaphylactic shock due to food - with concern for alpha gal syndrome (labs sent)   Seasonal allergic rhinitis - did not do testing since her symptoms were mild  History of cerebral cavernoma with subsequent seizures  Does not like board games - but is willing to try playing Pandemic sometime  Plan/Recommendations:   1. Anaphylactic shock due to food - We are going to try to do alpha gal testing today. - Anaphylaxis management plant provided. - EpiPen training reviewed.  - We will call you in 1-2 weeks with the results of the testing.  - You are already well versed on the diagnosis, but we can provide more information if the test is confirmatory.   2. Seasonal allergic rhinitis - We did not do testing since your symptoms are not that severe. - we could reconsider that in the future.   3. Return in about 1 year (around 06/29/2023).    This note in its entirety was forwarded to the Provider who requested this consultation.  Subjective:   Carmen Perez is a 41 y.o. female presenting today for evaluation of  Chief Complaint  Patient presents with   Allergy Testing    Alpha gal allergy lab work    Carmen Perez has a history of the following: Patient Active Problem List   Diagnosis Date Noted   Congenital anomaly of cerebrovascular system 06/28/2022   History of hyperthyroidism 11/18/2020   Epilepsy ([place]) 09/23/2019   Localization-related symptomatic epilepsy and epileptic syndromes with simple partial seizures, not intractable, without status epilepticus ([place]) 03/09/2016   Cerebral cavernoma 02/12/2016   Seizures ([place]) 02/12/2016   Postpartum care following vaginal delivery (6/26) 05/30/2012    History obtained from: chart review and patient. She is also here with her daughter.   Carmen Perez was referred by Patient, No Pcp Per.     Debara  is a 41 y.o. female presenting for an evaluation of red meat sensitivity . 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 She knows that she had ticks on her body and she would fling thme off of her. Her family never ate beef or pork. Then in 2014, she was eating lamb and she noticed that she got hives. She did not think too much of it. She gave some to her daughter and then touched her face and she developed urticaria. She suspected alpha gal allergy at that time. She has been at [employer] for 3 years and prior to that she was at [employer]. She is originally from [place].   She does not have an EpiPen. She is very interested in getting one. She has not been traveling at all with her current job and this has been less of a problem.   For the past 10 years, she has avoided all mammal meats. She is applying to the [employer] and they are going to have to move to [facility] and she might be deployed to other countries.    Allergic Rhinitis Symptom History: She does have some seasonal allergies that are worse in the spring and she pushes through. She does not really take anything at all for this.  She has never undergone allergy testing  She does have a history of seizures which is well controlled with her medications.  Otherwise, she is fairly healthy.  She does not like  board games. But he has heard of Pandemic and is willing to play given her interests in diseases.   Otherwise, there is no history of other atopic diseases, including asthma, drug allergies, stinging insect allergies, or contact dermatitis. There is no significant infectious history. Vaccinations are up to date.    Past Medical History: Patient Active Problem List   Diagnosis Date Noted   Congenital anomaly of cerebrovascular system 06/28/2022   History of hyperthyroidism 11/18/2020   Epilepsy ([place]) 09/23/2019   Localization-related symptomatic epilepsy and epileptic syndromes with simple partial seizures, not  intractable, without status epilepticus ([place]) 03/09/2016   Cerebral cavernoma 02/12/2016   Seizures ([place]) 02/12/2016   Postpartum care following vaginal delivery (6/26) 05/30/2012    Medication List:  Allergies as of 06/28/2022       Reactions   Lamictal [lamotrigine] Rash   Meat Extract Hives, Other (See Comments)   Lamb - Red meat        Medication List        Accurate as of June 28, 2022  9:45 AM. If you have any questions, ask your nurse or doctor.          EPINEPHrine 0.3 mg/0.3 mL Soaj injection Commonly known as: EPI-PEN Inject 0.3 mg into the muscle as needed for anaphylaxis. Started by: Valentina Shaggy, MD   HM MULTIVITAMIN ADULT GUMMY PO Take 1 each by mouth daily.   ibuprofen 200 MG tablet Commonly known as: ADVIL Take 200 mg by mouth every 6 (six) hours as needed for headache.   levETIRAcetam 500 MG 24 hr tablet Commonly known as: KEPPRA XR TAKE 2 TABLETS BY MOUTH AT NIGHT IN ADDITION TO LEVETIRACETAM '750MG'$  TABLET FOR TOTAL OF '1750MG'$  DAILY   levETIRAcetam 750 MG 24 hr tablet Commonly known as: KEPPRA XR Take 1 tablet at night (take with Keppra XR '500mg'$  2 tablets for a total of '1750mg'$  daily)   LORazepam 0.5 MG tablet Commonly known as: ATIVAN Take as needed for seizure        Birth History: non-contributory  Developmental History: non-contributory  Past Surgical History: Past Surgical History:  Procedure Laterality Date   ADENOIDECTOMY     KNEE DISLOCATION SURGERY Left Age 65   TONSILLECTOMY  12/05/2010     Family History: Family History  Problem Relation Age of Onset   Hypertension Mother    Hypothyroidism Mother    Hypertension Father    Transient ischemic attack Father    Other Neg Hx    Breast cancer Neg Hx      Social History: Honestie lives at home with her family.  She lives in a house that is approximately 42 years old.  There is wood and carpeting in the main living areas in the bedroom.  She has electric heating  and central cooling.  There is 1 cat inside the home.  There are no dust covers on her bedding.  There is no tobacco exposure.  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  She is not exposed to fumes, chemicals, or dust.  She does use a HEPA filter.   Review of Systems  Constitutional: Negative.  Negative for chills, fever, malaise/fatigue and weight loss.  HENT: Negative.  Negative for congestion, ear discharge and ear pain.   Eyes:  Negative for pain, discharge and redness.  Respiratory:  Negative for cough, sputum production, shortness of breath and wheezing.   Cardiovascular: Negative.  Negative for chest pain and palpitations.  Gastrointestinal:  Negative for abdominal pain, heartburn, nausea and vomiting.  Skin:  Positive for itching and rash.  Neurological:  Negative for dizziness and headaches.  Endo/Heme/Allergies:  Positive for environmental allergies. Does not bruise/bleed easily.       Objective:   Blood pressure 114/72, pulse 65, temperature 98.2 F (36.8 C), temperature source Temporal, resp. rate 20, height 5' 7.72" (1.72 m), weight 146 lb 9.6 oz (66.5 kg), SpO2 100 %. Body mass index is 22.48 kg/m.     Physical Exam Vitals reviewed.  Constitutional:      Appearance: She is well-developed.     Comments: Very lovely.  HENT:     Head: Normocephalic and atraumatic.     Right Ear: Tympanic membrane, ear canal and external ear normal. No drainage, swelling or tenderness. Tympanic membrane is not injected, scarred, erythematous, retracted or bulging.     Left Ear: Tympanic membrane, ear canal and external ear normal. No drainage, swelling or tenderness. Tympanic membrane is not injected, scarred, erythematous, retracted or bulging.     Nose: No nasal deformity, septal deviation, mucosal edema or rhinorrhea.     Right Turbinates: Not enlarged or swollen.     Left Turbinates: Not enlarged or swollen.     Right Sinus: No maxillary sinus  tenderness or frontal sinus tenderness.     Left Sinus: No maxillary sinus tenderness or frontal sinus tenderness.     Mouth/Throat:     Mouth: Mucous membranes are not pale and not dry.     Pharynx: Uvula midline.  Eyes:     General:        Right eye: No discharge.        Left eye: No discharge.     Conjunctiva/sclera: Conjunctivae normal.     Right eye: Right conjunctiva is not injected. No chemosis.    Left eye: Left conjunctiva is not injected. No chemosis.    Pupils: Pupils are equal, round, and reactive to light.  Cardiovascular:     Rate and Rhythm: Normal rate and regular rhythm.     Heart sounds: Normal heart sounds.  Pulmonary:     Effort: Pulmonary effort is normal. No tachypnea, accessory muscle usage or respiratory distress.     Breath sounds: Normal breath sounds. No wheezing, rhonchi or rales.  Chest:     Chest wall: No tenderness.  Abdominal:     Tenderness: There is no abdominal tenderness. There is no guarding or rebound.  Lymphadenopathy:     Head:     Right side of head: No submandibular, tonsillar or occipital adenopathy.     Left side of head: No submandibular, tonsillar or occipital adenopathy.     Cervical: No cervical adenopathy.  Skin:    Coloration: Skin is not pale.     Findings: No abrasion, erythema, petechiae or rash. Rash is not papular, urticarial or vesicular.  Neurological:     Mental Status: She is alert.  Psychiatric:        Behavior: Behavior is cooperative.      Diagnostic studies: labs sent instead           Salvatore Marvel, MD [facility] [place] [facility] [place]

## 2022-07-01 LAB — ALPHA-GAL PANEL
Allergen Lamb IgE: <0.1 kU/L
Beef IgE: <0.1 kU/L
IgE (Immunoglobulin E), Serum: 17 IU/mL (ref 6–495)
O215-IgE Alpha-Gal: <0.1 kU/L
Pork IgE: <0.1 kU/L

## 2023-05-02 ENCOUNTER — Encounter: Payer: Self-pay | Admitting: Neurology

## 2023-05-02 ENCOUNTER — Ambulatory Visit (INDEPENDENT_AMBULATORY_CARE_PROVIDER_SITE_OTHER): Payer: BC Managed Care – PPO | Admitting: Neurology

## 2023-05-02 DIAGNOSIS — G40109 Localization-related (focal) (partial) symptomatic epilepsy and epileptic syndromes with simple partial seizures, not intractable, without status epilepticus: Secondary | ICD-10-CM

## 2023-05-02 MED ORDER — LORAZEPAM 0.5 MG PO TABS: ORAL_TABLET | ORAL | 5 refills | Status: DC

## 2023-05-02 MED ORDER — LEVETIRACETAM ER 750 MG PO TB24: ORAL_TABLET | ORAL | 3 refills | Status: DC

## 2023-05-02 MED ORDER — LEVETIRACETAM ER 500 MG PO TB24: ORAL_TABLET | ORAL | 3 refills | Status: DC

## 2023-05-02 NOTE — Patient Instructions (Signed)
Always good to see you. Continue Levetiracetam ER 1750mg  daily. Follow-up in 1 year, call for any changes.    Seizure Precautions: 1. If medication has been prescribed for you to prevent seizures, take it exactly as directed.  Do not stop taking the medicine without talking to your doctor first, even if you have not had a seizure in a long time.   2. Avoid activities in which a seizure would cause danger to yourself or to others.  Don't operate dangerous machinery, swim alone, or climb in high or dangerous places, such as on ladders, roofs, or girders.  Do not drive unless your doctor says you may.  3. If you have any warning that you may have a seizure, lay down in a safe place where you can't hurt yourself.    4.  No driving for 6 months from last seizure, as per [facility].   Please refer to the following link on the Epilepsy Foundation of America's website for more information: http://www.epilepsyfoundation.org/answerplace/Social/driving/drivingu.cfm   5.  Maintain good sleep hygiene. Avoid alcohol.  6.  Notify your neurology if you are planning pregnancy or if you become pregnant.  7.  Contact your doctor if you have any problems that may be related to the medicine you are taking.  8.  Call 911 and bring the patient back to the ED if:        A.  The seizure lasts longer than 5 minutes.       B.  The patient doesn't awaken shortly after the seizure  C.  The patient has new problems such as difficulty seeing, speaking or moving  D.  The patient was injured during the seizure  E.  The patient has a temperature over 102 F (39C)  F.  The patient vomited and now is having trouble breathing

## 2023-05-02 NOTE — Progress Notes (Signed)
NEUROLOGY FOLLOW UP OFFICE NOTE  Carmen Perez [id number] 1981-10-05  HISTORY OF PRESENT ILLNESS: I had the pleasure of seeing Carmen Perez in follow-up in the neurology clinic on 05/02/2023.  The patient was last seen a year ago for seizures. Records and images were personally reviewed where available.  Since her last visit, she reports overall doing well seizure-free since January 2020 (at that time she had less medication due to pharmacy issues for a week). 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 She was able to continue activities, no staring/unresponsiveness or loss of time. She feels it could have also been emotional stress from family issues, she has been doing more stress management and this seems better. She has occasional numbness in both hands and the tingling on the right hand. She denies any olfactory/gustatory hallucinations, no myoclonic jerks. She has a few headaches here and there. She has noticed a groove in the frontal vertex of her skull, tender if she pushes on it. Sometimes the groove is more prominent than other times. No head injuries. No vision changes, no falls. She continues on Levetiracetam ER 1750mg  daily without significant side effects. She has not needed the prn lorazepam. She gets 8-9 hours of sleep. Mood is fine, a little anxious sometimes. 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  Notes from Dr. Conchita Paris reviewed, her last visit in June 2022 indictted that follow-up imaging is not needed at this point because there would be no indication for treatment.  History on Initial Assessment 03/07/2016: This is a pleasant 42 yo LH woman with no significant past medical history presenting after new onset seizure last 02/12/2016. She started feeling lightheaded and had a cramping pain in the right flank region. She reports having these  burning/crampy pains with lightheadedness occurring around 2 times a year for the past 10 years. They would usually resolve after she would hydrate herself, so she went to get water, but became more lightheaded where she could barely walk. She sat down then lost all feeling on her right arm from the collar bone down to all her fingers. She had difficulty lifting her right arm. She denied any clonic activity at that time. Over the course of 45 minutes, symptoms would come in a cycle with right flank burning followed by numbness in her arm for a minute, then go away where she could again move her arm normally. She noticed she could not focus on things. Due to worsening symptoms, EMS was called, and in the ambulance she had an episode where she reported her head was forcefully turned to the left side and she could not straighten it. Her right leg was jerking. She could talk and comprehend the entire time. While she was sitting in the ER for 2 hours, she had the recurrent cyclical burning sensation over the right flank followed by right arm numbness lasting 1 minute occurring every 8 minutes. She denied any confusion but on ER notes she reported a sense of detachment and disorientation while in the ambulance. She denies any olfactory/gustatory hallucinations, deja vu, rising epigastric sensation, previous episodes of gaps in time or staring/unresponsiveness. In the ER, CBC and CMP were unremarkable. She had an MRI brain with and without contrast which I personally reviewed, there was a 1.5 x 1.0 cm focus of T1 and T2 hypointensity and susceptibility artifact in the left cingulate gyrus with  a small amount of T2 hyperintensity centrally within the lesion. No surrounding edema or mass effect, no abnormal enhancement. Hippocampi symmetric with no abnormal signal or enhancement. Lesion in the cingulate gyrus was favored to represent a cavernoma. No evidence of recent hemorrhage. Her wake and drowsy EEG was normal. She was  discharged home on Keppra 500mg  BID and denies any further similar symptoms since 02/12/16.   Epilepsy Risk Factors:  Left cingulate gyrus lesion suggestive of cavernoma. Otherwise she had a normal birth and early development.  There is no history of febrile convulsions, CNS infections such as meningitis/encephalitis, significant traumatic brain injury, neurosurgical procedures, or family history of seizures  I personally reviewed repeat MRI brain with and without contrast done 02/07/17 which showed stable left posterior cingulate gyrus 1.5cm lesion with signal characteristics most consistent with cavernoma.   PAST MEDICAL HISTORY: Past Medical History:  Diagnosis Date   Cavernoma    HSV (herpes simplex virus) anogenital infection    Post partum thyroiditis    Seizures ([facility])     MEDICATIONS: Current Outpatient Medications on File Prior to Visit  Medication Sig Dispense Refill   EPINEPHrine 0.3 mg/0.3 mL IJ SOAJ injection Inject 0.3 mg into the muscle as needed for anaphylaxis. 1 each 2   ibuprofen (ADVIL,MOTRIN) 200 MG tablet Take 200 mg by mouth every 6 (six) hours as needed for headache.     levETIRAcetam (KEPPRA XR) 500 MG 24 hr tablet TAKE 2 TABLETS BY MOUTH AT NIGHT IN ADDITION TO LEVETIRACETAM 750MG  TABLET FOR TOTAL OF 1750MG  DAILY 180 tablet 3   Levetiracetam 750 MG TB24 Take 1 tablet at night (take with Keppra XR 500mg  2 tablets for a total of 1750mg  daily) 90 tablet 3   LORazepam (ATIVAN) 0.5 MG tablet Take as needed for seizure 10 tablet 5   Multiple Vitamins-Minerals (HM MULTIVITAMIN ADULT GUMMY PO) Take 1 each by mouth daily.     No current facility-administered medications on file prior to visit.    ALLERGIES: Allergies  Allergen Reactions   Lamictal [Lamotrigine] Rash    FAMILY HISTORY: Family History  Problem Relation Age of Onset   Hypertension Mother    Hypothyroidism Mother    Hypertension Father    Transient ischemic attack Father    Other Neg Hx    Breast  cancer Neg Hx     SOCIAL HISTORY: Social History   Socioeconomic History   Marital status: Married    Spouse name: Not on file   Number of children: 1   Years of education: Not on file   Highest education level: Not on file  Occupational History   Not on file  Tobacco Use   Smoking status: Never    Passive exposure: Never   Smokeless tobacco: Never  Vaping Use   Vaping Use: Never used  Substance and Sexual Activity   Alcohol use: No   Drug use: No   Sexual activity: Yes    Partners: Male    Birth control/protection: I.U.D.  Other Topics Concern   Not on file  Social History Narrative   Left handed      Phd      Lives with husband and daughter   Two story home   Caffeine 2 large cups a day   Social Determinants of Health   Financial Resource Strain: Not on file  Food Insecurity: Not on file  Transportation Needs: Not on file  Physical Activity: Not on file  Stress: Not on file  Social Connections:  Not on file  Intimate Partner Violence: Not on file     PHYSICAL EXAM: Vitals:   05/02/23 0829  BP: (!) 114/52  Pulse: 73  SpO2: 98%   General: No acute distress Head:  Normocephalic/atraumatic, palpation of area of concern on frontal region did not show any significant abnormalities Skin/Extremities: No rash, no edema Neurological Exam: alert and awake. No aphasia or dysarthria. Fund of knowledge is appropriate. Attention and concentration are normal.   Cranial nerves: Pupils equal, round. Extraocular movements intact with no nystagmus. Visual fields full.  No facial asymmetry.  Motor: Bulk and tone normal, muscle strength 5/5 throughout with no pronator drift.  Sensation intact to temperature. Finger to nose testing intact.  Gait narrow-based and steady, no ataxia.   IMPRESSION: This is a pleasant 42 yo LH woman who presented with recurrent stereotyped episodes where she describes a burning sensation in her right flank, followed by right arm numbness and  weakness. She was having multiple episodes on 02/12/16, at one point she had right leg clonic activity and head deviation to the left. MRI brain showed a left cingulate gyrus lesion suggestive of a cavernoma. Routine EEG normal. Symptoms suggestive of focal seizures arising from left-sided lesion. She continues to do well with no further clonic activity since March 2017, no sensory seizures since January 2020. There have been a few very brief episodes of lightheadedness, continue to monitor. Continue Levetiracetam ER 1750mg  daily, refills sent. She has prn lorazepam for seizure clusters. She has noticed a groove on the frontal region, no red flags on exam or imaging from 2018, continue to monitor. She is aware of [place] driving laws to stop driving after a seizure until 6 months seizure-free. Follow-up in 1 year, call for any changes.   Thank you for allowing me to participate in her care.  Please do not hesitate to call for any questions or concerns.   Patrcia Dolly, M.D.

## 2024-04-08 ENCOUNTER — Ambulatory Visit (INDEPENDENT_AMBULATORY_CARE_PROVIDER_SITE_OTHER): Payer: BC Managed Care – PPO | Admitting: Neurology

## 2024-04-08 ENCOUNTER — Encounter: Payer: Self-pay | Admitting: Neurology

## 2024-04-08 DIAGNOSIS — G40109 Localization-related (focal) (partial) symptomatic epilepsy and epileptic syndromes with simple partial seizures, not intractable, without status epilepticus: Secondary | ICD-10-CM

## 2024-04-08 MED ORDER — LORAZEPAM 0.5 MG PO TABS: ORAL_TABLET | ORAL | 5 refills | Status: AC

## 2024-04-08 MED ORDER — LEVETIRACETAM ER 500 MG PO TB24
ORAL_TABLET | ORAL | 4 refills | Status: AC
Start: 2024-04-08 — End: ?

## 2024-04-08 MED ORDER — LEVETIRACETAM ER 750 MG PO TB24
ORAL_TABLET | ORAL | 4 refills | Status: AC
Start: 2024-04-08 — End: ?

## 2024-04-08 NOTE — Patient Instructions (Signed)
 Always a pleasure to see you! Wishing you a very happy birthday and congratulations!! Continue Keppra , refills sent. Follow-up in 1 year, call for any changes.    Seizure Precautions: 1. If medication has been prescribed for you to prevent seizures, take it exactly as directed.  Do not stop taking the medicine without talking to your doctor first, even if you have not had a seizure in a long time.   2. Avoid activities in which a seizure would cause danger to yourself or to others.  Don't operate dangerous machinery, swim alone, or climb in high or dangerous places, such as on ladders, roofs, or girders.  Do not drive unless your doctor says you may.  3. If you have any warning that you may have a seizure, lay down in a safe place where you can't hurt yourself.    4.  No driving for 6 months from last seizure, as per [place]  state law.   Please refer to the following link on the Epilepsy Foundation of America's website for more information: http://www.epilepsyfoundation.org/answerplace/Social/driving/drivingu.cfm   5.  Maintain good sleep hygiene. Avoid alcohol.  6.  Contact your doctor if you have any problems that may be related to the medicine you are taking.  7.  Call 911 and bring the patient back to the ED if:        A.  The seizure lasts longer than 5 minutes.       B.  The patient doesn't awaken shortly after the seizure  C.  The patient has new problems such as difficulty seeing, speaking or moving  D.  The patient was injured during the seizure  E.  The patient has a temperature over 102 F (39C)  F.  The patient vomited and now is having trouble breathing

## 2024-04-08 NOTE — Progress Notes (Signed)
 NEUROLOGY FOLLOW UP OFFICE NOTE  Carmen Perez [id number] [birth year]  HISTORY OF PRESENT ILLNESS: I had the pleasure of seeing Carmen Perez in follow-up in the neurology clinic on 04/08/2024.  The patient was last seen a year ago for seizures. She is alone in the office today. Records and images were personally reviewed where available.  Since her last visit, she continues to do well seizure-free since January 2020 (at that time she had less medication due to pharmacy issues for a week). No clonic activity since 2017. Every once in a while she has some light symptoms with tingling on her right upper extremity, she states trigger might be emotional stress but nothing specific. She denies any staring/unresponsive episodes, gaps in time. No significant headaches, dizziness, vision changes, no falls. She is graduating this week [employer]). Memory good. Sleep has not been good, there has been more anxiety and depression, she thinks these may be related to perimenopause.   Notes from Dr. Nat Badger previously reviewed, her last visit in June 2022 indicated that follow-up imaging is not needed at this point because there would be no indication for treatment.   History on Initial Assessment 03/07/2016: This is a pleasant 43 yo LH woman with no significant past medical history presenting after new onset seizure last 02/12/2016. She started feeling lightheaded and had a cramping pain in the right flank region. She reports having these burning/crampy pains with lightheadedness occurring around 2 times a year for the past 10 years. They would usually resolve after she would hydrate herself, so she went to get water, but became more lightheaded where she could barely walk. She sat down then lost all feeling on her right arm from the collar bone down to all her fingers. She had difficulty lifting her right arm. She denied any clonic activity at that time. Over the course of 45 minutes, symptoms would come in a  cycle with right flank burning followed by numbness in her arm for a minute, then go away where she could again move her arm normally. She noticed she could not focus on things. Due to worsening symptoms, EMS was called, and in the ambulance she had an episode where she reported her head was forcefully turned to the left side and she could not straighten it. Her right leg was jerking. She could talk and comprehend the entire time. While she was sitting in the ER for 2 hours, she had the recurrent cyclical burning sensation over the right flank followed by right arm numbness lasting 1 minute occurring every 8 minutes. She denied any confusion but on ER notes she reported a sense of detachment and disorientation while in the ambulance. She denies any olfactory/gustatory hallucinations, deja vu, rising epigastric sensation, previous episodes of gaps in time or staring/unresponsiveness. In the ER, CBC and CMP were unremarkable. She had an MRI brain with and without contrast which I personally reviewed, there was a 1.5 x 1.0 cm focus of T1 and T2 hypointensity and susceptibility artifact in the left cingulate gyrus with a small amount of T2 hyperintensity centrally within the lesion. No surrounding edema or mass effect, no abnormal enhancement. Hippocampi symmetric with no abnormal signal or enhancement. Lesion in the cingulate gyrus was favored to represent a cavernoma. No evidence of recent hemorrhage. Her wake and drowsy EEG was normal. She was discharged home on Keppra  500mg  BID and denies any further similar symptoms since 02/12/16.   Epilepsy Risk Factors:  Left cingulate gyrus lesion suggestive of cavernoma.  Otherwise she had a normal birth and early development.  There is no history of febrile convulsions, CNS infections such as meningitis/encephalitis, significant traumatic brain injury, neurosurgical procedures, or family history of seizures  I personally reviewed repeat MRI brain with and without contrast  done 02/07/17 which showed stable left posterior cingulate gyrus 1.5cm lesion with signal characteristics most consistent with cavernoma.   PAST MEDICAL HISTORY: Past Medical History:  Diagnosis Date   Cavernoma    HSV (herpes simplex virus) anogenital infection    Post partum thyroiditis    Seizures (HCC)     MEDICATIONS: Current Outpatient Medications on File Prior to Visit  Medication Sig Dispense Refill   EPINEPHrine  0.3 mg/0.3 mL IJ SOAJ injection Inject 0.3 mg into the muscle as needed for anaphylaxis. 1 each 2   ibuprofen  (ADVIL ,MOTRIN ) 200 MG tablet Take 200 mg by mouth every 6 (six) hours as needed for headache.     levETIRAcetam  (KEPPRA  XR) 500 MG 24 hr tablet TAKE 2 TABLETS BY MOUTH AT NIGHT IN ADDITION TO LEVETIRACETAM  750MG  TABLET FOR TOTAL OF 1750MG  DAILY 180 tablet 3   levETIRAcetam  (KEPPRA  XR) 750 MG 24 hr tablet Take 1 tablet at night (take with Keppra  XR 500mg  2 tablets for a total of 1750mg  daily) 90 tablet 3   LORazepam  (ATIVAN ) 0.5 MG tablet Take as needed for seizure 10 tablet 5   Multiple Vitamins-Minerals (HM MULTIVITAMIN ADULT GUMMY PO) Take 1 each by mouth daily.     No current facility-administered medications on file prior to visit.    ALLERGIES: Allergies  Allergen Reactions   Lamictal  [Lamotrigine ] Rash    FAMILY HISTORY: Family History  Problem Relation Age of Onset   Hypertension Mother    Hypothyroidism Mother    Hypertension Father    Transient ischemic attack Father    Other Neg Hx    Breast cancer Neg Hx     SOCIAL HISTORY: Social History   Socioeconomic History   Marital status: Married    Spouse name: Not on file   Number of children: 1   Years of education: Not on file   Highest education level: Not on file  Occupational History   Not on file  Tobacco Use   Smoking status: Never    Passive exposure: Never   Smokeless tobacco: Never  Vaping Use   Vaping status: Never Used  Substance and Sexual Activity   Alcohol use: No    Drug use: No   Sexual activity: Yes    Partners: Male    Birth control/protection: I.U.D.  Other Topics Concern   Not on file  Social History Narrative   Left handed      Phd      Lives with husband and daughter   Two story home   Caffeine 2 large cups a day   Social Drivers of Corporate investment banker Strain: Not on file  Food Insecurity: Not on file  Transportation Needs: Not on file  Physical Activity: Not on file  Stress: Not on file  Social Connections: Not on file  Intimate Partner Violence: Not on file     PHYSICAL EXAM: Vitals:   04/08/24 0837  BP: 106/61  Pulse: 100  SpO2: 99%   General: No acute distress Head:  Normocephalic/atraumatic Skin/Extremities: No rash, no edema Neurological Exam: alert and awake. No aphasia or dysarthria. Fund of knowledge is appropriate. Attention and concentration are normal.   Cranial nerves: Pupils equal, round. Extraocular movements intact with no  nystagmus. Visual fields full.  No facial asymmetry.  Motor: Bulk and tone normal, muscle strength 5/5 throughout with no pronator drift.   Finger to nose testing intact.  Gait narrow-based and steady, able to tandem walk adequately.  Romberg negative.   IMPRESSION: This is a pleasant 43 yo LH woman who presented with recurrent stereotyped episodes where she describes a burning sensation in her right flank, followed by right arm numbness and weakness. She was having multiple episodes on 02/12/16, at one point she had right leg clonic activity and head deviation to the left. MRI brain showed a left cingulate gyrus lesion suggestive of a cavernoma. Routine EEG normal. Symptoms suggestive of focal seizures arising from left-sided lesion. No further clonic activity since March 2017, no sensory seizures since January 2020. Continue Levetiracetam  1750mg  daily, she has prn lorazepam  for seizure clusters. Plan for repeat surveillance MRI in 2027 unless there are new symptoms. She is aware of [place]  driving laws to stop driving after a seizure until 6 months seizure-free. Follow-up in 1 year, call for any changes.   Thank you for allowing me to participate in her care.  Please do not hesitate to call for any questions or concerns.    Rayfield Cairo, M.D.

## 2024-09-03 ENCOUNTER — Encounter: Payer: Self-pay | Admitting: Adult Health

## 2024-09-03 ENCOUNTER — Telehealth (INDEPENDENT_AMBULATORY_CARE_PROVIDER_SITE_OTHER): Admitting: Adult Health

## 2024-09-03 VITALS — Ht 67.0 in | Wt 146.0 lb

## 2024-09-03 DIAGNOSIS — F329 Major depressive disorder, single episode, unspecified: Secondary | ICD-10-CM | POA: Diagnosis not present

## 2024-09-03 NOTE — Progress Notes (Signed)
 Virtual Visit via Video Note  I connected with pt @ on 09/03/24 at 11:00 AM EDT by a video enabled telemedicine application and verified that I am speaking with the correct person using two identifiers.   I discussed the limitations of evaluation and management by telemedicine and the availability of in person appointments. The patient expressed understanding and agreed to proceed.  I discussed the assessment and treatment plan with the patient. The patient was provided an opportunity to ask questions and all were answered. The patient agreed with the plan and demonstrated an understanding of the instructions.   The patient was advised to call back or seek an in-person evaluation if the symptoms worsen or if the condition fails to improve as anticipated.  I provided 60 minutes of non-face-to-face time during this encounter.  The patient was located at home.  The provider was located at [facility].   Carmen LOISE Sayers, NP   MD/PA/NP Initial Note  09/03/2024 5:43 PM Carmen Perez  MRN:  [id number]  Chief Complaint:   HPI:   Patient seen today for initial psychiatric evaluation.  Diagnosed with epilepsy in 2017 - taking Keppra  1750mg  daily.   Describes mood today as ok. Pleasant. Mood symptoms - reports depression and irritability. Reports stable interest and motivation - I love my work. Reports she doesn't have a hobby and is on social media more than she should be. Reports she is easily annoyed - too much going on - hormonal component. Reports anxiety - 2 to 3 times a week - begins to flush. Reports she has had heart palpitations in the past. Denies panic attacks. Reports worry, rumination and over thinking - more so with the family situation. Reports obsessive thoughts and behaviors - checking. Reports awaiting the next disaster - more so with the family. Reports parents are local - she and brother are their primary caregivers. Reports mood as stable - I have a  stable mood. Stating I feel really numb about things. Reports she is struggling with mental health issues, but has maintained mood stability without medications thus far. Working with a Veterinary surgeon every 6 months. Energy levels lower - it's down. Active, reports a regular exercise routine.  Enjoys some usual interests and activities. Married. Lives with husband and daughter - 73. Family local - brother - parents (disabled). Spending time with family. Appetite adequate - I could be thinking about it more. Weight gain - 7 pounds since starting school. Reports being remote and mostly staying home. Sleeps well most nights. Averages 9 hours of broken sleep - peri-menopause - flushing. Reports waking up thinking about family - stressors in the family. Reports focus and concentration stable. Completing tasks. Managing aspects of household. 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 Has a PhD in biology. Recently finished masters in [employer].  Denies SI or HI.  Denies AH or VH. Denies self harm. Denies substance use. Denies alcohol use.  Previous medication trials:   Denies  Visit Diagnosis:    ICD-10-CM   1. Reactive depression (situational)  F32.9       Past Psychiatric History: Denies psychiatric hospitalization.   Past Medical History:  Past Medical History:  Diagnosis Date   Cavernoma    HSV (herpes simplex virus) anogenital infection    Post partum thyroiditis    Seizures (HCC)     Past Surgical History:  Procedure Laterality Date   ADENOIDECTOMY     KNEE DISLOCATION SURGERY Left Age 64   TONSILLECTOMY  12/05/2010    Family Psychiatric History: Reports family history of mental illness. Has a 85 yo daughter with anxiety and OCD, a brother OCD, anxiety and bipolar disorder and a sister with bipolar disorder.       Family History:  Family History  Problem Relation Age of Onset   Hypertension Mother    Hypothyroidism Mother    Hypertension Father    Transient ischemic  attack Father    Other Neg Hx    Breast cancer Neg Hx     Social History:  Social History   Socioeconomic History   Marital status: Married    Spouse name: Not on file   Number of children: 1   Years of education: Not on file   Highest education level: Not on file  Occupational History   Not on file  Tobacco Use   Smoking status: Never    Passive exposure: Never   Smokeless tobacco: Never  Vaping Use   Vaping status: Never Used  Substance and Sexual Activity   Alcohol use: No   Drug use: No   Sexual activity: Yes    Partners: Male    Birth control/protection: I.U.D.  Other Topics Concern   Not on file  Social History Narrative   Left handed      Phd      Lives with husband and daughter   Two story home   Caffeine 2 large cups a day   Social Drivers of Corporate investment banker Strain: Not on file  Food Insecurity: Not on file  Transportation Needs: Not on file  Physical Activity: Not on file  Stress: Not on file  Social Connections: Not on file    Allergies:  Allergies  Allergen Reactions   Lamictal  [Lamotrigine ] Rash    Metabolic Disorder Labs: Lab Results  Component Value Date   HGBA1C 5.3 02/12/2016   MPG 105 02/12/2016   No results found for: PROLACTIN Lab Results  Component Value Date   TRIG 33 02/12/2016   No results found for: TSH  Therapeutic Level Labs: No results found for: LITHIUM No results found for: VALPROATE No results found for: CBMZ  Current Medications: Current Outpatient Medications  Medication Sig Dispense Refill   EPINEPHrine  0.3 mg/0.3 mL IJ SOAJ injection Inject 0.3 mg into the muscle as needed for anaphylaxis. 1 each 2   ibuprofen  (ADVIL ,MOTRIN ) 200 MG tablet Take 200 mg by mouth every 6 (six) hours as needed for headache.     levETIRAcetam  (KEPPRA  XR) 500 MG 24 hr tablet TAKE 2 TABLETS BY MOUTH AT NIGHT IN ADDITION TO LEVETIRACETAM  750MG  TABLET FOR TOTAL OF 1750MG  DAILY 180 tablet 4   levETIRAcetam   (KEPPRA  XR) 750 MG 24 hr tablet Take 1 tablet at night (take with Keppra  XR 500mg  2 tablets for a total of 1750mg  daily) 90 tablet 4   LORazepam  (ATIVAN ) 0.5 MG tablet Take as needed for seizure 10 tablet 5   Multiple Vitamins-Minerals (HM MULTIVITAMIN ADULT GUMMY PO) Take 1 each by mouth daily.     No current facility-administered medications for this visit.    Medication Side Effects: none  Orders placed this visit:  No orders of the defined types were placed in this encounter.   Psychiatric Specialty Exam:  Review of Systems  Musculoskeletal:  Negative for gait problem.  Neurological:  Negative for tremors.  Psychiatric/Behavioral:         Please refer to HPI    Height 5' 7 (1.702 m), weight 146  lb (66.2 kg).Body mass index is 22.87 kg/m.  General Appearance: Casual and Neat  Eye Contact:  Good  Speech:  Clear and Coherent and Normal Rate  Volume:  Normal  Mood:  Euthymic  Affect:  Appropriate and Congruent  Thought Process:  Coherent and Descriptions of Associations: Intact  Orientation:  Full (Time, Place, and Person)  Thought Content: Logical   Suicidal Thoughts:  No  Homicidal Thoughts:  No  Memory:  WNL  Judgement:  Good  Insight:  Good  Psychomotor Activity:  Normal  Concentration:  Concentration: Good and Attention Span: Good  Recall:  Good  Fund of Knowledge: Good  Language: Good  Assets:  Communication Skills Desire for Improvement Financial Resources/Insurance Housing Intimacy Leisure Time Physical Health Resilience Social Support Talents/Skills Transportation Vocational/Educational  ADL's:  Intact  Cognition: WNL  Prognosis:  Good   Screenings:  GAD-7    Loss adjuster, chartered Office Visit from 02/26/2016 in [facility] [place]. [facility]  Total GAD-7 Score 0   PHQ2-9    Flowsheet Row Office Visit from 05/13/2020 in [facility] Office Visit from 05/05/2016 in [facility] Office Visit from 03/02/2016 in Primary Care at [facility] Visit from 02/26/2016 in [facility] [place]. [facility]  PHQ-2 Total Score 0 0 0 0    Receiving Psychotherapy: No   Treatment Plan/Recommendations:   Plan:  PDMP reviewed  RTC as needed  Patient advised to contact office with any questions, adverse effects, or acute worsening in signs and symptoms.   60 minutes spent dedicated to the care of this patient on the date of this encounter to include pre-visit review of records, ordering of medication, post visit documentation, and face-to-face time with the patient discussing reactive depression (situational) . Discussed continuing current medication regimen.   Demoni Gergen N Shafer Swamy, NP

## 2024-10-28 ENCOUNTER — Encounter: Payer: Self-pay | Admitting: Neurology

## 2025-04-08 ENCOUNTER — Ambulatory Visit: Admitting: Neurology

## 2025-04-09 ENCOUNTER — Ambulatory Visit: Admitting: Neurology
